# Patient Record
Sex: Female | Born: 1950 | Race: Black or African American | Hispanic: No | Smoking: Never smoker
Health system: Southern US, Community
[De-identification: ages and names within clinical notes are randomized; demographics above are authoritative.]

## PROBLEM LIST (undated history)

## (undated) DIAGNOSIS — E119 Type 2 diabetes mellitus without complications: Secondary | ICD-10-CM

## (undated) DIAGNOSIS — E785 Hyperlipidemia, unspecified: Secondary | ICD-10-CM

## (undated) DIAGNOSIS — I10 Essential (primary) hypertension: Secondary | ICD-10-CM

## (undated) DIAGNOSIS — M545 Low back pain, unspecified: Secondary | ICD-10-CM

## (undated) DIAGNOSIS — IMO0002 Reserved for concepts with insufficient information to code with codable children: Secondary | ICD-10-CM

## (undated) DIAGNOSIS — I2510 Atherosclerotic heart disease of native coronary artery without angina pectoris: Secondary | ICD-10-CM

## (undated) DIAGNOSIS — N189 Chronic kidney disease, unspecified: Secondary | ICD-10-CM

## (undated) DIAGNOSIS — H9192 Unspecified hearing loss, left ear: Secondary | ICD-10-CM

## (undated) DIAGNOSIS — Z9889 Other specified postprocedural states: Secondary | ICD-10-CM

## (undated) DIAGNOSIS — G43909 Migraine, unspecified, not intractable, without status migrainosus: Secondary | ICD-10-CM

## (undated) DIAGNOSIS — M797 Fibromyalgia: Secondary | ICD-10-CM

## (undated) DIAGNOSIS — L719 Rosacea, unspecified: Secondary | ICD-10-CM

## (undated) DIAGNOSIS — G4733 Obstructive sleep apnea (adult) (pediatric): Secondary | ICD-10-CM

## (undated) HISTORY — DX: Obstructive sleep apnea (adult) (pediatric): G47.33

## (undated) HISTORY — DX: Essential (primary) hypertension: I10

## (undated) HISTORY — DX: Type 2 diabetes mellitus without complications: E11.9

## (undated) HISTORY — DX: Atherosclerotic heart disease of native coronary artery without angina pectoris: I25.10

## (undated) HISTORY — DX: Low back pain, unspecified: M54.50

## (undated) HISTORY — DX: Hyperlipidemia, unspecified: E78.5

## (undated) HISTORY — DX: Fibromyalgia: M79.7

## (undated) HISTORY — DX: Unspecified hearing loss, left ear: H91.92

## (undated) HISTORY — DX: Reserved for concepts with insufficient information to code with codable children: IMO0002

## (undated) HISTORY — DX: Rosacea, unspecified: L71.9

## (undated) HISTORY — DX: Chronic kidney disease, unspecified: N18.9

## (undated) HISTORY — DX: Migraine, unspecified, not intractable, without status migrainosus: G43.909

---
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
## 1990-12-20 HISTORY — PX: TOTAL VAGINAL HYSTERECTOMY: SHX2548

## 2000-12-20 HISTORY — PX: ANKLE SURGERY: SHX546

## 2000-12-20 HISTORY — PX: CORONARY ARTERY BYPASS GRAFT: SHX141

## 2003-12-21 HISTORY — PX: HERNIA REPAIR: SHX51

## 2004-12-20 HISTORY — PX: WRIST SURGERY: SHX841

## 2007-01-20 HISTORY — PX: CORONARY ANGIOPLASTY: SHX604

## 2015-12-21 DIAGNOSIS — Z9889 Other specified postprocedural states: Secondary | ICD-10-CM

## 2015-12-21 HISTORY — PX: SHOULDER SURGERY: SHX246

## 2015-12-21 HISTORY — DX: Other specified postprocedural states: Z98.890

## 2016-12-28 DIAGNOSIS — R262 Difficulty in walking, not elsewhere classified: Secondary | ICD-10-CM | POA: Diagnosis not present

## 2016-12-28 DIAGNOSIS — M797 Fibromyalgia: Secondary | ICD-10-CM | POA: Diagnosis not present

## 2016-12-31 DIAGNOSIS — M797 Fibromyalgia: Secondary | ICD-10-CM | POA: Diagnosis not present

## 2016-12-31 DIAGNOSIS — R262 Difficulty in walking, not elsewhere classified: Secondary | ICD-10-CM | POA: Diagnosis not present

## 2017-01-11 DIAGNOSIS — R262 Difficulty in walking, not elsewhere classified: Secondary | ICD-10-CM | POA: Diagnosis not present

## 2017-01-11 DIAGNOSIS — M797 Fibromyalgia: Secondary | ICD-10-CM | POA: Diagnosis not present

## 2017-01-14 DIAGNOSIS — M797 Fibromyalgia: Secondary | ICD-10-CM | POA: Diagnosis not present

## 2017-01-14 DIAGNOSIS — R262 Difficulty in walking, not elsewhere classified: Secondary | ICD-10-CM | POA: Diagnosis not present

## 2017-01-20 HISTORY — PX: CARDIAC CATHETERIZATION: SHX172

## 2017-01-28 DIAGNOSIS — R4182 Altered mental status, unspecified: Secondary | ICD-10-CM | POA: Diagnosis not present

## 2017-01-28 DIAGNOSIS — G43719 Chronic migraine without aura, intractable, without status migrainosus: Secondary | ICD-10-CM | POA: Diagnosis not present

## 2017-01-31 DIAGNOSIS — R2689 Other abnormalities of gait and mobility: Secondary | ICD-10-CM | POA: Diagnosis not present

## 2017-02-03 DIAGNOSIS — R2689 Other abnormalities of gait and mobility: Secondary | ICD-10-CM | POA: Diagnosis not present

## 2017-02-08 DIAGNOSIS — R2689 Other abnormalities of gait and mobility: Secondary | ICD-10-CM | POA: Diagnosis not present

## 2017-02-10 DIAGNOSIS — R55 Syncope and collapse: Secondary | ICD-10-CM | POA: Diagnosis not present

## 2017-02-10 DIAGNOSIS — I5030 Unspecified diastolic (congestive) heart failure: Secondary | ICD-10-CM | POA: Diagnosis not present

## 2017-02-10 DIAGNOSIS — E669 Obesity, unspecified: Secondary | ICD-10-CM | POA: Diagnosis not present

## 2017-02-10 DIAGNOSIS — T50905S Adverse effect of unspecified drugs, medicaments and biological substances, sequela: Secondary | ICD-10-CM | POA: Diagnosis not present

## 2017-02-10 DIAGNOSIS — E785 Hyperlipidemia, unspecified: Secondary | ICD-10-CM | POA: Diagnosis not present

## 2017-02-10 DIAGNOSIS — R2689 Other abnormalities of gait and mobility: Secondary | ICD-10-CM | POA: Diagnosis not present

## 2017-02-10 DIAGNOSIS — I10 Essential (primary) hypertension: Secondary | ICD-10-CM | POA: Diagnosis not present

## 2017-02-10 DIAGNOSIS — E119 Type 2 diabetes mellitus without complications: Secondary | ICD-10-CM | POA: Diagnosis not present

## 2017-02-10 DIAGNOSIS — I259 Chronic ischemic heart disease, unspecified: Secondary | ICD-10-CM | POA: Diagnosis not present

## 2017-02-10 DIAGNOSIS — G4733 Obstructive sleep apnea (adult) (pediatric): Secondary | ICD-10-CM | POA: Diagnosis not present

## 2017-02-14 DIAGNOSIS — R2689 Other abnormalities of gait and mobility: Secondary | ICD-10-CM | POA: Diagnosis not present

## 2017-02-17 DIAGNOSIS — R2689 Other abnormalities of gait and mobility: Secondary | ICD-10-CM | POA: Diagnosis not present

## 2017-02-21 DIAGNOSIS — R2689 Other abnormalities of gait and mobility: Secondary | ICD-10-CM | POA: Diagnosis not present

## 2017-02-23 DIAGNOSIS — R2689 Other abnormalities of gait and mobility: Secondary | ICD-10-CM | POA: Diagnosis not present

## 2017-02-28 DIAGNOSIS — R2689 Other abnormalities of gait and mobility: Secondary | ICD-10-CM | POA: Diagnosis not present

## 2017-04-12 DIAGNOSIS — E669 Obesity, unspecified: Secondary | ICD-10-CM | POA: Diagnosis not present

## 2017-04-12 DIAGNOSIS — I951 Orthostatic hypotension: Secondary | ICD-10-CM | POA: Diagnosis not present

## 2017-04-12 DIAGNOSIS — I10 Essential (primary) hypertension: Secondary | ICD-10-CM | POA: Diagnosis not present

## 2017-04-12 DIAGNOSIS — G43011 Migraine without aura, intractable, with status migrainosus: Secondary | ICD-10-CM | POA: Diagnosis not present

## 2017-04-12 DIAGNOSIS — I700 Atherosclerosis of aorta: Secondary | ICD-10-CM | POA: Diagnosis not present

## 2017-04-12 DIAGNOSIS — J3089 Other allergic rhinitis: Secondary | ICD-10-CM | POA: Diagnosis not present

## 2017-04-12 DIAGNOSIS — I25118 Atherosclerotic heart disease of native coronary artery with other forms of angina pectoris: Secondary | ICD-10-CM | POA: Diagnosis not present

## 2017-04-12 DIAGNOSIS — E7800 Pure hypercholesterolemia, unspecified: Secondary | ICD-10-CM | POA: Diagnosis not present

## 2017-04-12 DIAGNOSIS — I208 Other forms of angina pectoris: Secondary | ICD-10-CM | POA: Diagnosis not present

## 2017-04-12 DIAGNOSIS — M3219 Other organ or system involvement in systemic lupus erythematosus: Secondary | ICD-10-CM | POA: Diagnosis not present

## 2017-04-12 DIAGNOSIS — E1165 Type 2 diabetes mellitus with hyperglycemia: Secondary | ICD-10-CM | POA: Diagnosis not present

## 2017-06-24 DIAGNOSIS — G43719 Chronic migraine without aura, intractable, without status migrainosus: Secondary | ICD-10-CM | POA: Diagnosis not present

## 2017-06-24 DIAGNOSIS — R2689 Other abnormalities of gait and mobility: Secondary | ICD-10-CM | POA: Diagnosis not present

## 2017-06-24 DIAGNOSIS — G43711 Chronic migraine without aura, intractable, with status migrainosus: Secondary | ICD-10-CM | POA: Diagnosis not present

## 2017-06-24 DIAGNOSIS — R51 Headache: Secondary | ICD-10-CM | POA: Diagnosis not present

## 2017-08-17 DIAGNOSIS — M3219 Other organ or system involvement in systemic lupus erythematosus: Secondary | ICD-10-CM | POA: Diagnosis not present

## 2017-08-17 DIAGNOSIS — J3089 Other allergic rhinitis: Secondary | ICD-10-CM | POA: Diagnosis not present

## 2017-08-17 DIAGNOSIS — I10 Essential (primary) hypertension: Secondary | ICD-10-CM | POA: Diagnosis not present

## 2017-08-17 DIAGNOSIS — E7800 Pure hypercholesterolemia, unspecified: Secondary | ICD-10-CM | POA: Diagnosis not present

## 2017-08-17 DIAGNOSIS — E669 Obesity, unspecified: Secondary | ICD-10-CM | POA: Diagnosis not present

## 2017-08-17 DIAGNOSIS — I25118 Atherosclerotic heart disease of native coronary artery with other forms of angina pectoris: Secondary | ICD-10-CM | POA: Diagnosis not present

## 2017-08-17 DIAGNOSIS — I700 Atherosclerosis of aorta: Secondary | ICD-10-CM | POA: Diagnosis not present

## 2017-08-17 DIAGNOSIS — G43011 Migraine without aura, intractable, with status migrainosus: Secondary | ICD-10-CM | POA: Diagnosis not present

## 2017-08-17 DIAGNOSIS — I951 Orthostatic hypotension: Secondary | ICD-10-CM | POA: Diagnosis not present

## 2017-08-17 DIAGNOSIS — E1165 Type 2 diabetes mellitus with hyperglycemia: Secondary | ICD-10-CM | POA: Diagnosis not present

## 2017-08-17 DIAGNOSIS — I208 Other forms of angina pectoris: Secondary | ICD-10-CM | POA: Diagnosis not present

## 2017-08-18 DIAGNOSIS — R2689 Other abnormalities of gait and mobility: Secondary | ICD-10-CM | POA: Diagnosis not present

## 2017-09-12 DIAGNOSIS — H8113 Benign paroxysmal vertigo, bilateral: Secondary | ICD-10-CM | POA: Diagnosis not present

## 2017-09-12 DIAGNOSIS — R2689 Other abnormalities of gait and mobility: Secondary | ICD-10-CM | POA: Diagnosis not present

## 2017-09-12 DIAGNOSIS — R531 Weakness: Secondary | ICD-10-CM | POA: Diagnosis not present

## 2017-09-12 DIAGNOSIS — R296 Repeated falls: Secondary | ICD-10-CM | POA: Diagnosis not present

## 2017-09-14 DIAGNOSIS — R296 Repeated falls: Secondary | ICD-10-CM | POA: Diagnosis not present

## 2017-09-14 DIAGNOSIS — H8113 Benign paroxysmal vertigo, bilateral: Secondary | ICD-10-CM | POA: Diagnosis not present

## 2017-09-14 DIAGNOSIS — R2689 Other abnormalities of gait and mobility: Secondary | ICD-10-CM | POA: Diagnosis not present

## 2017-09-14 DIAGNOSIS — R531 Weakness: Secondary | ICD-10-CM | POA: Diagnosis not present

## 2017-09-19 DIAGNOSIS — R531 Weakness: Secondary | ICD-10-CM | POA: Diagnosis not present

## 2017-09-19 DIAGNOSIS — R296 Repeated falls: Secondary | ICD-10-CM | POA: Diagnosis not present

## 2017-09-19 DIAGNOSIS — H8113 Benign paroxysmal vertigo, bilateral: Secondary | ICD-10-CM | POA: Diagnosis not present

## 2017-09-19 DIAGNOSIS — R2689 Other abnormalities of gait and mobility: Secondary | ICD-10-CM | POA: Diagnosis not present

## 2017-09-29 DIAGNOSIS — H8113 Benign paroxysmal vertigo, bilateral: Secondary | ICD-10-CM | POA: Diagnosis not present

## 2017-09-29 DIAGNOSIS — R531 Weakness: Secondary | ICD-10-CM | POA: Diagnosis not present

## 2017-09-29 DIAGNOSIS — R296 Repeated falls: Secondary | ICD-10-CM | POA: Diagnosis not present

## 2017-09-29 DIAGNOSIS — R2689 Other abnormalities of gait and mobility: Secondary | ICD-10-CM | POA: Diagnosis not present

## 2017-10-06 DIAGNOSIS — R531 Weakness: Secondary | ICD-10-CM | POA: Diagnosis not present

## 2017-10-06 DIAGNOSIS — H8113 Benign paroxysmal vertigo, bilateral: Secondary | ICD-10-CM | POA: Diagnosis not present

## 2017-10-06 DIAGNOSIS — R296 Repeated falls: Secondary | ICD-10-CM | POA: Diagnosis not present

## 2017-10-06 DIAGNOSIS — R2689 Other abnormalities of gait and mobility: Secondary | ICD-10-CM | POA: Diagnosis not present

## 2017-10-11 DIAGNOSIS — R2689 Other abnormalities of gait and mobility: Secondary | ICD-10-CM | POA: Diagnosis not present

## 2017-10-11 DIAGNOSIS — R531 Weakness: Secondary | ICD-10-CM | POA: Diagnosis not present

## 2017-10-11 DIAGNOSIS — H8113 Benign paroxysmal vertigo, bilateral: Secondary | ICD-10-CM | POA: Diagnosis not present

## 2017-10-11 DIAGNOSIS — R296 Repeated falls: Secondary | ICD-10-CM | POA: Diagnosis not present

## 2017-10-13 DIAGNOSIS — R296 Repeated falls: Secondary | ICD-10-CM | POA: Diagnosis not present

## 2017-10-13 DIAGNOSIS — R2689 Other abnormalities of gait and mobility: Secondary | ICD-10-CM | POA: Diagnosis not present

## 2017-10-13 DIAGNOSIS — H8113 Benign paroxysmal vertigo, bilateral: Secondary | ICD-10-CM | POA: Diagnosis not present

## 2017-10-13 DIAGNOSIS — R531 Weakness: Secondary | ICD-10-CM | POA: Diagnosis not present

## 2017-10-18 DIAGNOSIS — R2689 Other abnormalities of gait and mobility: Secondary | ICD-10-CM | POA: Diagnosis not present

## 2017-10-18 DIAGNOSIS — R531 Weakness: Secondary | ICD-10-CM | POA: Diagnosis not present

## 2017-10-18 DIAGNOSIS — H8113 Benign paroxysmal vertigo, bilateral: Secondary | ICD-10-CM | POA: Diagnosis not present

## 2017-10-18 DIAGNOSIS — R296 Repeated falls: Secondary | ICD-10-CM | POA: Diagnosis not present

## 2017-10-20 DIAGNOSIS — R2689 Other abnormalities of gait and mobility: Secondary | ICD-10-CM | POA: Diagnosis not present

## 2017-10-20 DIAGNOSIS — L718 Other rosacea: Secondary | ICD-10-CM | POA: Diagnosis not present

## 2017-10-20 DIAGNOSIS — R296 Repeated falls: Secondary | ICD-10-CM | POA: Diagnosis not present

## 2017-10-20 DIAGNOSIS — R531 Weakness: Secondary | ICD-10-CM | POA: Diagnosis not present

## 2017-10-20 DIAGNOSIS — H8113 Benign paroxysmal vertigo, bilateral: Secondary | ICD-10-CM | POA: Diagnosis not present

## 2017-10-25 DIAGNOSIS — H8113 Benign paroxysmal vertigo, bilateral: Secondary | ICD-10-CM | POA: Diagnosis not present

## 2017-10-25 DIAGNOSIS — R531 Weakness: Secondary | ICD-10-CM | POA: Diagnosis not present

## 2017-10-25 DIAGNOSIS — R2689 Other abnormalities of gait and mobility: Secondary | ICD-10-CM | POA: Diagnosis not present

## 2017-10-25 DIAGNOSIS — R296 Repeated falls: Secondary | ICD-10-CM | POA: Diagnosis not present

## 2017-10-27 DIAGNOSIS — R531 Weakness: Secondary | ICD-10-CM | POA: Diagnosis not present

## 2017-10-27 DIAGNOSIS — R296 Repeated falls: Secondary | ICD-10-CM | POA: Diagnosis not present

## 2017-10-27 DIAGNOSIS — H8113 Benign paroxysmal vertigo, bilateral: Secondary | ICD-10-CM | POA: Diagnosis not present

## 2017-10-27 DIAGNOSIS — R2689 Other abnormalities of gait and mobility: Secondary | ICD-10-CM | POA: Diagnosis not present

## 2017-11-02 DIAGNOSIS — R296 Repeated falls: Secondary | ICD-10-CM | POA: Diagnosis not present

## 2017-11-02 DIAGNOSIS — H8113 Benign paroxysmal vertigo, bilateral: Secondary | ICD-10-CM | POA: Diagnosis not present

## 2017-11-02 DIAGNOSIS — R531 Weakness: Secondary | ICD-10-CM | POA: Diagnosis not present

## 2017-11-02 DIAGNOSIS — R2689 Other abnormalities of gait and mobility: Secondary | ICD-10-CM | POA: Diagnosis not present

## 2017-11-04 DIAGNOSIS — R531 Weakness: Secondary | ICD-10-CM | POA: Diagnosis not present

## 2017-11-04 DIAGNOSIS — H8113 Benign paroxysmal vertigo, bilateral: Secondary | ICD-10-CM | POA: Diagnosis not present

## 2017-11-04 DIAGNOSIS — R296 Repeated falls: Secondary | ICD-10-CM | POA: Diagnosis not present

## 2017-11-04 DIAGNOSIS — R2689 Other abnormalities of gait and mobility: Secondary | ICD-10-CM | POA: Diagnosis not present

## 2017-11-28 DIAGNOSIS — H8113 Benign paroxysmal vertigo, bilateral: Secondary | ICD-10-CM | POA: Diagnosis not present

## 2017-11-28 DIAGNOSIS — R531 Weakness: Secondary | ICD-10-CM | POA: Diagnosis not present

## 2017-11-28 DIAGNOSIS — R2689 Other abnormalities of gait and mobility: Secondary | ICD-10-CM | POA: Diagnosis not present

## 2017-11-28 DIAGNOSIS — R296 Repeated falls: Secondary | ICD-10-CM | POA: Diagnosis not present

## 2017-11-30 DIAGNOSIS — R296 Repeated falls: Secondary | ICD-10-CM | POA: Diagnosis not present

## 2017-11-30 DIAGNOSIS — R531 Weakness: Secondary | ICD-10-CM | POA: Diagnosis not present

## 2017-11-30 DIAGNOSIS — H8113 Benign paroxysmal vertigo, bilateral: Secondary | ICD-10-CM | POA: Diagnosis not present

## 2017-11-30 DIAGNOSIS — R2689 Other abnormalities of gait and mobility: Secondary | ICD-10-CM | POA: Diagnosis not present

## 2017-12-01 DIAGNOSIS — L718 Other rosacea: Secondary | ICD-10-CM | POA: Diagnosis not present

## 2017-12-08 DIAGNOSIS — I259 Chronic ischemic heart disease, unspecified: Secondary | ICD-10-CM | POA: Diagnosis not present

## 2017-12-08 DIAGNOSIS — E782 Mixed hyperlipidemia: Secondary | ICD-10-CM | POA: Diagnosis present

## 2017-12-08 DIAGNOSIS — E785 Hyperlipidemia, unspecified: Secondary | ICD-10-CM | POA: Diagnosis not present

## 2017-12-08 DIAGNOSIS — Z9861 Coronary angioplasty status: Secondary | ICD-10-CM | POA: Diagnosis not present

## 2017-12-08 DIAGNOSIS — I951 Orthostatic hypotension: Secondary | ICD-10-CM | POA: Diagnosis present

## 2017-12-08 DIAGNOSIS — Z9119 Patient's noncompliance with other medical treatment and regimen: Secondary | ICD-10-CM | POA: Diagnosis not present

## 2017-12-08 DIAGNOSIS — Z7902 Long term (current) use of antithrombotics/antiplatelets: Secondary | ICD-10-CM | POA: Diagnosis not present

## 2017-12-08 DIAGNOSIS — I25118 Atherosclerotic heart disease of native coronary artery with other forms of angina pectoris: Secondary | ICD-10-CM | POA: Diagnosis not present

## 2017-12-08 DIAGNOSIS — Z87442 Personal history of urinary calculi: Secondary | ICD-10-CM | POA: Diagnosis not present

## 2017-12-08 DIAGNOSIS — Z885 Allergy status to narcotic agent status: Secondary | ICD-10-CM | POA: Diagnosis not present

## 2017-12-08 DIAGNOSIS — M797 Fibromyalgia: Secondary | ICD-10-CM | POA: Diagnosis present

## 2017-12-08 DIAGNOSIS — Z6831 Body mass index (BMI) 31.0-31.9, adult: Secondary | ICD-10-CM | POA: Diagnosis not present

## 2017-12-08 DIAGNOSIS — I252 Old myocardial infarction: Secondary | ICD-10-CM | POA: Diagnosis not present

## 2017-12-08 DIAGNOSIS — Z794 Long term (current) use of insulin: Secondary | ICD-10-CM | POA: Diagnosis not present

## 2017-12-08 DIAGNOSIS — Z01818 Encounter for other preprocedural examination: Secondary | ICD-10-CM | POA: Diagnosis not present

## 2017-12-08 DIAGNOSIS — R748 Abnormal levels of other serum enzymes: Secondary | ICD-10-CM | POA: Diagnosis not present

## 2017-12-08 DIAGNOSIS — T82855A Stenosis of coronary artery stent, initial encounter: Secondary | ICD-10-CM | POA: Diagnosis not present

## 2017-12-08 DIAGNOSIS — I200 Unstable angina: Secondary | ICD-10-CM | POA: Diagnosis not present

## 2017-12-08 DIAGNOSIS — E669 Obesity, unspecified: Secondary | ICD-10-CM | POA: Diagnosis present

## 2017-12-08 DIAGNOSIS — I25110 Atherosclerotic heart disease of native coronary artery with unstable angina pectoris: Secondary | ICD-10-CM | POA: Diagnosis present

## 2017-12-08 DIAGNOSIS — E1165 Type 2 diabetes mellitus with hyperglycemia: Secondary | ICD-10-CM | POA: Diagnosis present

## 2017-12-08 DIAGNOSIS — Z90710 Acquired absence of both cervix and uterus: Secondary | ICD-10-CM | POA: Diagnosis not present

## 2017-12-08 DIAGNOSIS — R079 Chest pain, unspecified: Secondary | ICD-10-CM | POA: Diagnosis not present

## 2017-12-08 DIAGNOSIS — M329 Systemic lupus erythematosus, unspecified: Secondary | ICD-10-CM | POA: Diagnosis present

## 2017-12-08 DIAGNOSIS — I700 Atherosclerosis of aorta: Secondary | ICD-10-CM | POA: Diagnosis present

## 2017-12-08 DIAGNOSIS — G4733 Obstructive sleep apnea (adult) (pediatric): Secondary | ICD-10-CM | POA: Diagnosis present

## 2017-12-08 DIAGNOSIS — E1122 Type 2 diabetes mellitus with diabetic chronic kidney disease: Secondary | ICD-10-CM | POA: Diagnosis not present

## 2017-12-08 DIAGNOSIS — Z951 Presence of aortocoronary bypass graft: Secondary | ICD-10-CM | POA: Diagnosis not present

## 2017-12-08 DIAGNOSIS — G43009 Migraine without aura, not intractable, without status migrainosus: Secondary | ICD-10-CM | POA: Diagnosis present

## 2017-12-08 DIAGNOSIS — I214 Non-ST elevation (NSTEMI) myocardial infarction: Secondary | ICD-10-CM | POA: Diagnosis not present

## 2017-12-08 DIAGNOSIS — N183 Chronic kidney disease, stage 3 (moderate): Secondary | ICD-10-CM | POA: Diagnosis not present

## 2017-12-08 DIAGNOSIS — Z888 Allergy status to other drugs, medicaments and biological substances status: Secondary | ICD-10-CM | POA: Diagnosis not present

## 2017-12-08 DIAGNOSIS — R072 Precordial pain: Secondary | ICD-10-CM | POA: Diagnosis not present

## 2017-12-08 DIAGNOSIS — I10 Essential (primary) hypertension: Secondary | ICD-10-CM | POA: Diagnosis present

## 2017-12-08 DIAGNOSIS — Z886 Allergy status to analgesic agent status: Secondary | ICD-10-CM | POA: Diagnosis not present

## 2017-12-08 DIAGNOSIS — I519 Heart disease, unspecified: Secondary | ICD-10-CM | POA: Diagnosis not present

## 2017-12-15 DIAGNOSIS — E119 Type 2 diabetes mellitus without complications: Secondary | ICD-10-CM | POA: Diagnosis not present

## 2017-12-15 DIAGNOSIS — I259 Chronic ischemic heart disease, unspecified: Secondary | ICD-10-CM | POA: Diagnosis not present

## 2017-12-15 DIAGNOSIS — E669 Obesity, unspecified: Secondary | ICD-10-CM | POA: Diagnosis not present

## 2017-12-15 DIAGNOSIS — E785 Hyperlipidemia, unspecified: Secondary | ICD-10-CM | POA: Diagnosis not present

## 2017-12-15 DIAGNOSIS — Z789 Other specified health status: Secondary | ICD-10-CM | POA: Diagnosis not present

## 2017-12-15 DIAGNOSIS — R55 Syncope and collapse: Secondary | ICD-10-CM | POA: Diagnosis not present

## 2017-12-15 DIAGNOSIS — G4733 Obstructive sleep apnea (adult) (pediatric): Secondary | ICD-10-CM | POA: Diagnosis not present

## 2017-12-15 DIAGNOSIS — I5030 Unspecified diastolic (congestive) heart failure: Secondary | ICD-10-CM | POA: Diagnosis not present

## 2017-12-15 DIAGNOSIS — I10 Essential (primary) hypertension: Secondary | ICD-10-CM | POA: Diagnosis not present

## 2017-12-15 DIAGNOSIS — I252 Old myocardial infarction: Secondary | ICD-10-CM | POA: Diagnosis not present

## 2018-01-02 DIAGNOSIS — G43011 Migraine without aura, intractable, with status migrainosus: Secondary | ICD-10-CM | POA: Diagnosis not present

## 2018-01-02 DIAGNOSIS — J3089 Other allergic rhinitis: Secondary | ICD-10-CM | POA: Diagnosis not present

## 2018-01-02 DIAGNOSIS — E1165 Type 2 diabetes mellitus with hyperglycemia: Secondary | ICD-10-CM | POA: Diagnosis not present

## 2018-01-02 DIAGNOSIS — I951 Orthostatic hypotension: Secondary | ICD-10-CM | POA: Diagnosis not present

## 2018-01-02 DIAGNOSIS — E7800 Pure hypercholesterolemia, unspecified: Secondary | ICD-10-CM | POA: Diagnosis not present

## 2018-01-02 DIAGNOSIS — I25118 Atherosclerotic heart disease of native coronary artery with other forms of angina pectoris: Secondary | ICD-10-CM | POA: Diagnosis not present

## 2018-01-02 DIAGNOSIS — M3219 Other organ or system involvement in systemic lupus erythematosus: Secondary | ICD-10-CM | POA: Diagnosis not present

## 2018-01-02 DIAGNOSIS — I214 Non-ST elevation (NSTEMI) myocardial infarction: Secondary | ICD-10-CM | POA: Diagnosis not present

## 2018-01-02 DIAGNOSIS — Z23 Encounter for immunization: Secondary | ICD-10-CM | POA: Diagnosis not present

## 2018-01-02 DIAGNOSIS — I10 Essential (primary) hypertension: Secondary | ICD-10-CM | POA: Diagnosis not present

## 2018-01-02 DIAGNOSIS — I208 Other forms of angina pectoris: Secondary | ICD-10-CM | POA: Diagnosis not present

## 2018-01-02 DIAGNOSIS — E669 Obesity, unspecified: Secondary | ICD-10-CM | POA: Diagnosis not present

## 2018-01-04 DIAGNOSIS — I2510 Atherosclerotic heart disease of native coronary artery without angina pectoris: Secondary | ICD-10-CM | POA: Diagnosis not present

## 2018-01-04 DIAGNOSIS — Z955 Presence of coronary angioplasty implant and graft: Secondary | ICD-10-CM | POA: Diagnosis not present

## 2018-01-11 DIAGNOSIS — I2510 Atherosclerotic heart disease of native coronary artery without angina pectoris: Secondary | ICD-10-CM | POA: Diagnosis not present

## 2018-01-11 DIAGNOSIS — Z955 Presence of coronary angioplasty implant and graft: Secondary | ICD-10-CM | POA: Diagnosis not present

## 2018-01-13 DIAGNOSIS — I259 Chronic ischemic heart disease, unspecified: Secondary | ICD-10-CM | POA: Diagnosis not present

## 2018-01-13 DIAGNOSIS — I10 Essential (primary) hypertension: Secondary | ICD-10-CM | POA: Diagnosis not present

## 2018-01-13 DIAGNOSIS — I252 Old myocardial infarction: Secondary | ICD-10-CM | POA: Diagnosis not present

## 2018-01-13 DIAGNOSIS — Z789 Other specified health status: Secondary | ICD-10-CM | POA: Diagnosis not present

## 2018-01-13 DIAGNOSIS — E785 Hyperlipidemia, unspecified: Secondary | ICD-10-CM | POA: Diagnosis not present

## 2018-01-13 DIAGNOSIS — G4733 Obstructive sleep apnea (adult) (pediatric): Secondary | ICD-10-CM | POA: Diagnosis not present

## 2018-01-13 DIAGNOSIS — E119 Type 2 diabetes mellitus without complications: Secondary | ICD-10-CM | POA: Diagnosis not present

## 2018-01-13 DIAGNOSIS — R55 Syncope and collapse: Secondary | ICD-10-CM | POA: Diagnosis not present

## 2018-01-13 DIAGNOSIS — I5030 Unspecified diastolic (congestive) heart failure: Secondary | ICD-10-CM | POA: Diagnosis not present

## 2018-01-13 DIAGNOSIS — E669 Obesity, unspecified: Secondary | ICD-10-CM | POA: Diagnosis not present

## 2018-01-16 DIAGNOSIS — Z955 Presence of coronary angioplasty implant and graft: Secondary | ICD-10-CM | POA: Diagnosis not present

## 2018-01-16 DIAGNOSIS — I2510 Atherosclerotic heart disease of native coronary artery without angina pectoris: Secondary | ICD-10-CM | POA: Diagnosis not present

## 2018-01-25 DIAGNOSIS — Z955 Presence of coronary angioplasty implant and graft: Secondary | ICD-10-CM | POA: Diagnosis not present

## 2018-01-25 DIAGNOSIS — I2510 Atherosclerotic heart disease of native coronary artery without angina pectoris: Secondary | ICD-10-CM | POA: Diagnosis not present

## 2018-01-31 DIAGNOSIS — I2510 Atherosclerotic heart disease of native coronary artery without angina pectoris: Secondary | ICD-10-CM | POA: Diagnosis not present

## 2018-01-31 DIAGNOSIS — Z955 Presence of coronary angioplasty implant and graft: Secondary | ICD-10-CM | POA: Diagnosis not present

## 2018-02-02 DIAGNOSIS — I2510 Atherosclerotic heart disease of native coronary artery without angina pectoris: Secondary | ICD-10-CM | POA: Diagnosis not present

## 2018-02-02 DIAGNOSIS — Z955 Presence of coronary angioplasty implant and graft: Secondary | ICD-10-CM | POA: Diagnosis not present

## 2018-02-06 DIAGNOSIS — I2510 Atherosclerotic heart disease of native coronary artery without angina pectoris: Secondary | ICD-10-CM | POA: Diagnosis not present

## 2018-02-06 DIAGNOSIS — Z955 Presence of coronary angioplasty implant and graft: Secondary | ICD-10-CM | POA: Diagnosis not present

## 2018-02-13 DIAGNOSIS — Z955 Presence of coronary angioplasty implant and graft: Secondary | ICD-10-CM | POA: Diagnosis not present

## 2018-02-13 DIAGNOSIS — I2510 Atherosclerotic heart disease of native coronary artery without angina pectoris: Secondary | ICD-10-CM | POA: Diagnosis not present

## 2018-02-27 DIAGNOSIS — Z955 Presence of coronary angioplasty implant and graft: Secondary | ICD-10-CM | POA: Diagnosis not present

## 2018-02-27 DIAGNOSIS — I2510 Atherosclerotic heart disease of native coronary artery without angina pectoris: Secondary | ICD-10-CM | POA: Diagnosis not present

## 2018-03-01 DIAGNOSIS — I2510 Atherosclerotic heart disease of native coronary artery without angina pectoris: Secondary | ICD-10-CM | POA: Diagnosis not present

## 2018-03-01 DIAGNOSIS — Z955 Presence of coronary angioplasty implant and graft: Secondary | ICD-10-CM | POA: Diagnosis not present

## 2018-03-10 DIAGNOSIS — E1165 Type 2 diabetes mellitus with hyperglycemia: Secondary | ICD-10-CM | POA: Diagnosis not present

## 2018-03-10 DIAGNOSIS — R197 Diarrhea, unspecified: Secondary | ICD-10-CM | POA: Diagnosis not present

## 2018-03-13 DIAGNOSIS — Z955 Presence of coronary angioplasty implant and graft: Secondary | ICD-10-CM | POA: Diagnosis not present

## 2018-03-13 DIAGNOSIS — I2510 Atherosclerotic heart disease of native coronary artery without angina pectoris: Secondary | ICD-10-CM | POA: Diagnosis not present

## 2018-03-13 DIAGNOSIS — L299 Pruritus, unspecified: Secondary | ICD-10-CM | POA: Diagnosis not present

## 2018-03-13 DIAGNOSIS — L718 Other rosacea: Secondary | ICD-10-CM | POA: Diagnosis not present

## 2018-03-15 DIAGNOSIS — Z955 Presence of coronary angioplasty implant and graft: Secondary | ICD-10-CM | POA: Diagnosis not present

## 2018-03-15 DIAGNOSIS — I2510 Atherosclerotic heart disease of native coronary artery without angina pectoris: Secondary | ICD-10-CM | POA: Diagnosis not present

## 2018-03-20 DIAGNOSIS — I2510 Atherosclerotic heart disease of native coronary artery without angina pectoris: Secondary | ICD-10-CM | POA: Diagnosis not present

## 2018-03-20 DIAGNOSIS — Z955 Presence of coronary angioplasty implant and graft: Secondary | ICD-10-CM | POA: Diagnosis not present

## 2018-03-22 DIAGNOSIS — I2510 Atherosclerotic heart disease of native coronary artery without angina pectoris: Secondary | ICD-10-CM | POA: Diagnosis not present

## 2018-03-22 DIAGNOSIS — Z955 Presence of coronary angioplasty implant and graft: Secondary | ICD-10-CM | POA: Diagnosis not present

## 2018-03-27 DIAGNOSIS — I2510 Atherosclerotic heart disease of native coronary artery without angina pectoris: Secondary | ICD-10-CM | POA: Diagnosis not present

## 2018-03-27 DIAGNOSIS — M154 Erosive (osteo)arthritis: Secondary | ICD-10-CM | POA: Diagnosis not present

## 2018-03-27 DIAGNOSIS — E559 Vitamin D deficiency, unspecified: Secondary | ICD-10-CM | POA: Diagnosis not present

## 2018-03-27 DIAGNOSIS — M542 Cervicalgia: Secondary | ICD-10-CM | POA: Diagnosis not present

## 2018-03-27 DIAGNOSIS — M797 Fibromyalgia: Secondary | ICD-10-CM | POA: Diagnosis not present

## 2018-03-27 DIAGNOSIS — Z955 Presence of coronary angioplasty implant and graft: Secondary | ICD-10-CM | POA: Diagnosis not present

## 2018-03-29 DIAGNOSIS — I2510 Atherosclerotic heart disease of native coronary artery without angina pectoris: Secondary | ICD-10-CM | POA: Diagnosis not present

## 2018-03-29 DIAGNOSIS — Z955 Presence of coronary angioplasty implant and graft: Secondary | ICD-10-CM | POA: Diagnosis not present

## 2018-03-30 DIAGNOSIS — M797 Fibromyalgia: Secondary | ICD-10-CM | POA: Diagnosis not present

## 2018-03-30 DIAGNOSIS — M542 Cervicalgia: Secondary | ICD-10-CM | POA: Diagnosis not present

## 2018-03-30 DIAGNOSIS — R293 Abnormal posture: Secondary | ICD-10-CM | POA: Diagnosis not present

## 2018-03-30 DIAGNOSIS — M25512 Pain in left shoulder: Secondary | ICD-10-CM | POA: Diagnosis not present

## 2018-04-03 DIAGNOSIS — M797 Fibromyalgia: Secondary | ICD-10-CM | POA: Diagnosis not present

## 2018-04-03 DIAGNOSIS — M542 Cervicalgia: Secondary | ICD-10-CM | POA: Diagnosis not present

## 2018-04-03 DIAGNOSIS — I2510 Atherosclerotic heart disease of native coronary artery without angina pectoris: Secondary | ICD-10-CM | POA: Diagnosis not present

## 2018-04-03 DIAGNOSIS — M25512 Pain in left shoulder: Secondary | ICD-10-CM | POA: Diagnosis not present

## 2018-04-03 DIAGNOSIS — R293 Abnormal posture: Secondary | ICD-10-CM | POA: Diagnosis not present

## 2018-04-03 DIAGNOSIS — Z955 Presence of coronary angioplasty implant and graft: Secondary | ICD-10-CM | POA: Diagnosis not present

## 2018-04-05 DIAGNOSIS — Z955 Presence of coronary angioplasty implant and graft: Secondary | ICD-10-CM | POA: Diagnosis not present

## 2018-04-05 DIAGNOSIS — I2510 Atherosclerotic heart disease of native coronary artery without angina pectoris: Secondary | ICD-10-CM | POA: Diagnosis not present

## 2018-04-13 DIAGNOSIS — R600 Localized edema: Secondary | ICD-10-CM | POA: Diagnosis not present

## 2018-04-18 DIAGNOSIS — I2510 Atherosclerotic heart disease of native coronary artery without angina pectoris: Secondary | ICD-10-CM | POA: Diagnosis not present

## 2018-04-18 DIAGNOSIS — M797 Fibromyalgia: Secondary | ICD-10-CM | POA: Diagnosis not present

## 2018-04-18 DIAGNOSIS — Z1211 Encounter for screening for malignant neoplasm of colon: Secondary | ICD-10-CM | POA: Diagnosis not present

## 2018-04-18 DIAGNOSIS — E559 Vitamin D deficiency, unspecified: Secondary | ICD-10-CM | POA: Diagnosis not present

## 2018-04-18 DIAGNOSIS — Z955 Presence of coronary angioplasty implant and graft: Secondary | ICD-10-CM | POA: Diagnosis not present

## 2018-04-20 DIAGNOSIS — I2510 Atherosclerotic heart disease of native coronary artery without angina pectoris: Secondary | ICD-10-CM | POA: Diagnosis not present

## 2018-04-20 DIAGNOSIS — Z955 Presence of coronary angioplasty implant and graft: Secondary | ICD-10-CM | POA: Diagnosis not present

## 2018-04-21 DIAGNOSIS — M542 Cervicalgia: Secondary | ICD-10-CM | POA: Diagnosis not present

## 2018-04-21 DIAGNOSIS — M25512 Pain in left shoulder: Secondary | ICD-10-CM | POA: Diagnosis not present

## 2018-04-21 DIAGNOSIS — M797 Fibromyalgia: Secondary | ICD-10-CM | POA: Diagnosis not present

## 2018-04-21 DIAGNOSIS — R293 Abnormal posture: Secondary | ICD-10-CM | POA: Diagnosis not present

## 2018-04-25 DIAGNOSIS — Z955 Presence of coronary angioplasty implant and graft: Secondary | ICD-10-CM | POA: Diagnosis not present

## 2018-04-25 DIAGNOSIS — I2510 Atherosclerotic heart disease of native coronary artery without angina pectoris: Secondary | ICD-10-CM | POA: Diagnosis not present

## 2018-04-27 DIAGNOSIS — I2510 Atherosclerotic heart disease of native coronary artery without angina pectoris: Secondary | ICD-10-CM | POA: Diagnosis not present

## 2018-04-27 DIAGNOSIS — Z955 Presence of coronary angioplasty implant and graft: Secondary | ICD-10-CM | POA: Diagnosis not present

## 2018-05-01 DIAGNOSIS — M542 Cervicalgia: Secondary | ICD-10-CM | POA: Diagnosis not present

## 2018-05-01 DIAGNOSIS — M47812 Spondylosis without myelopathy or radiculopathy, cervical region: Secondary | ICD-10-CM | POA: Diagnosis not present

## 2018-05-01 DIAGNOSIS — M5412 Radiculopathy, cervical region: Secondary | ICD-10-CM | POA: Diagnosis not present

## 2018-05-01 DIAGNOSIS — R293 Abnormal posture: Secondary | ICD-10-CM | POA: Diagnosis not present

## 2018-05-01 DIAGNOSIS — M4802 Spinal stenosis, cervical region: Secondary | ICD-10-CM | POA: Diagnosis not present

## 2018-05-01 DIAGNOSIS — G8929 Other chronic pain: Secondary | ICD-10-CM | POA: Diagnosis not present

## 2018-05-01 DIAGNOSIS — M797 Fibromyalgia: Secondary | ICD-10-CM | POA: Diagnosis not present

## 2018-05-01 DIAGNOSIS — E1165 Type 2 diabetes mellitus with hyperglycemia: Secondary | ICD-10-CM | POA: Diagnosis not present

## 2018-05-01 DIAGNOSIS — M7918 Myalgia, other site: Secondary | ICD-10-CM | POA: Diagnosis not present

## 2018-05-01 DIAGNOSIS — M25512 Pain in left shoulder: Secondary | ICD-10-CM | POA: Diagnosis not present

## 2018-05-01 DIAGNOSIS — I25118 Atherosclerotic heart disease of native coronary artery with other forms of angina pectoris: Secondary | ICD-10-CM | POA: Diagnosis not present

## 2018-05-01 DIAGNOSIS — E669 Obesity, unspecified: Secondary | ICD-10-CM | POA: Diagnosis not present

## 2018-05-02 DIAGNOSIS — I25118 Atherosclerotic heart disease of native coronary artery with other forms of angina pectoris: Secondary | ICD-10-CM | POA: Diagnosis not present

## 2018-05-02 DIAGNOSIS — E1165 Type 2 diabetes mellitus with hyperglycemia: Secondary | ICD-10-CM | POA: Diagnosis not present

## 2018-05-02 DIAGNOSIS — I700 Atherosclerosis of aorta: Secondary | ICD-10-CM | POA: Diagnosis not present

## 2018-05-02 DIAGNOSIS — J3089 Other allergic rhinitis: Secondary | ICD-10-CM | POA: Diagnosis not present

## 2018-05-02 DIAGNOSIS — N183 Chronic kidney disease, stage 3 (moderate): Secondary | ICD-10-CM | POA: Diagnosis not present

## 2018-05-02 DIAGNOSIS — Z13820 Encounter for screening for osteoporosis: Secondary | ICD-10-CM | POA: Diagnosis not present

## 2018-05-02 DIAGNOSIS — M3219 Other organ or system involvement in systemic lupus erythematosus: Secondary | ICD-10-CM | POA: Diagnosis not present

## 2018-05-02 DIAGNOSIS — I951 Orthostatic hypotension: Secondary | ICD-10-CM | POA: Diagnosis not present

## 2018-05-02 DIAGNOSIS — E7800 Pure hypercholesterolemia, unspecified: Secondary | ICD-10-CM | POA: Diagnosis not present

## 2018-05-02 DIAGNOSIS — I10 Essential (primary) hypertension: Secondary | ICD-10-CM | POA: Diagnosis not present

## 2018-05-02 DIAGNOSIS — E669 Obesity, unspecified: Secondary | ICD-10-CM | POA: Diagnosis not present

## 2018-05-02 DIAGNOSIS — I2510 Atherosclerotic heart disease of native coronary artery without angina pectoris: Secondary | ICD-10-CM | POA: Diagnosis not present

## 2018-05-02 DIAGNOSIS — Z955 Presence of coronary angioplasty implant and graft: Secondary | ICD-10-CM | POA: Diagnosis not present

## 2018-05-02 DIAGNOSIS — Z0000 Encounter for general adult medical examination without abnormal findings: Secondary | ICD-10-CM | POA: Diagnosis not present

## 2018-05-04 DIAGNOSIS — I2510 Atherosclerotic heart disease of native coronary artery without angina pectoris: Secondary | ICD-10-CM | POA: Diagnosis not present

## 2018-05-04 DIAGNOSIS — Z955 Presence of coronary angioplasty implant and graft: Secondary | ICD-10-CM | POA: Diagnosis not present

## 2018-05-05 DIAGNOSIS — Z13820 Encounter for screening for osteoporosis: Secondary | ICD-10-CM | POA: Diagnosis not present

## 2018-05-08 DIAGNOSIS — I2510 Atherosclerotic heart disease of native coronary artery without angina pectoris: Secondary | ICD-10-CM | POA: Diagnosis not present

## 2018-05-08 DIAGNOSIS — Z955 Presence of coronary angioplasty implant and graft: Secondary | ICD-10-CM | POA: Diagnosis not present

## 2018-05-10 DIAGNOSIS — R293 Abnormal posture: Secondary | ICD-10-CM | POA: Diagnosis not present

## 2018-05-10 DIAGNOSIS — M797 Fibromyalgia: Secondary | ICD-10-CM | POA: Diagnosis not present

## 2018-05-10 DIAGNOSIS — M542 Cervicalgia: Secondary | ICD-10-CM | POA: Diagnosis not present

## 2018-05-10 DIAGNOSIS — M25512 Pain in left shoulder: Secondary | ICD-10-CM | POA: Diagnosis not present

## 2018-05-11 DIAGNOSIS — Z955 Presence of coronary angioplasty implant and graft: Secondary | ICD-10-CM | POA: Diagnosis not present

## 2018-05-11 DIAGNOSIS — R55 Syncope and collapse: Secondary | ICD-10-CM | POA: Diagnosis not present

## 2018-05-11 DIAGNOSIS — E119 Type 2 diabetes mellitus without complications: Secondary | ICD-10-CM | POA: Diagnosis not present

## 2018-05-11 DIAGNOSIS — I10 Essential (primary) hypertension: Secondary | ICD-10-CM | POA: Diagnosis not present

## 2018-05-11 DIAGNOSIS — G4733 Obstructive sleep apnea (adult) (pediatric): Secondary | ICD-10-CM | POA: Diagnosis not present

## 2018-05-11 DIAGNOSIS — I2510 Atherosclerotic heart disease of native coronary artery without angina pectoris: Secondary | ICD-10-CM | POA: Diagnosis not present

## 2018-05-11 DIAGNOSIS — I259 Chronic ischemic heart disease, unspecified: Secondary | ICD-10-CM | POA: Diagnosis not present

## 2018-05-11 DIAGNOSIS — I5030 Unspecified diastolic (congestive) heart failure: Secondary | ICD-10-CM | POA: Diagnosis not present

## 2018-05-11 DIAGNOSIS — Z789 Other specified health status: Secondary | ICD-10-CM | POA: Diagnosis not present

## 2018-05-11 DIAGNOSIS — E785 Hyperlipidemia, unspecified: Secondary | ICD-10-CM | POA: Diagnosis not present

## 2018-05-11 DIAGNOSIS — E669 Obesity, unspecified: Secondary | ICD-10-CM | POA: Diagnosis not present

## 2018-05-11 DIAGNOSIS — I252 Old myocardial infarction: Secondary | ICD-10-CM | POA: Diagnosis not present

## 2018-05-16 DIAGNOSIS — L659 Nonscarring hair loss, unspecified: Secondary | ICD-10-CM | POA: Diagnosis not present

## 2018-05-16 DIAGNOSIS — L718 Other rosacea: Secondary | ICD-10-CM | POA: Diagnosis not present

## 2018-05-18 DIAGNOSIS — R293 Abnormal posture: Secondary | ICD-10-CM | POA: Diagnosis not present

## 2018-05-18 DIAGNOSIS — Z955 Presence of coronary angioplasty implant and graft: Secondary | ICD-10-CM | POA: Diagnosis not present

## 2018-05-18 DIAGNOSIS — M542 Cervicalgia: Secondary | ICD-10-CM | POA: Diagnosis not present

## 2018-05-18 DIAGNOSIS — M25512 Pain in left shoulder: Secondary | ICD-10-CM | POA: Diagnosis not present

## 2018-05-18 DIAGNOSIS — I2510 Atherosclerotic heart disease of native coronary artery without angina pectoris: Secondary | ICD-10-CM | POA: Diagnosis not present

## 2018-05-18 DIAGNOSIS — M797 Fibromyalgia: Secondary | ICD-10-CM | POA: Diagnosis not present

## 2018-05-18 DIAGNOSIS — E1165 Type 2 diabetes mellitus with hyperglycemia: Secondary | ICD-10-CM | POA: Diagnosis not present

## 2018-05-23 DIAGNOSIS — Z955 Presence of coronary angioplasty implant and graft: Secondary | ICD-10-CM | POA: Diagnosis not present

## 2018-05-23 DIAGNOSIS — I2510 Atherosclerotic heart disease of native coronary artery without angina pectoris: Secondary | ICD-10-CM | POA: Diagnosis not present

## 2018-05-25 DIAGNOSIS — E1165 Type 2 diabetes mellitus with hyperglycemia: Secondary | ICD-10-CM | POA: Diagnosis not present

## 2018-05-31 DIAGNOSIS — M47812 Spondylosis without myelopathy or radiculopathy, cervical region: Secondary | ICD-10-CM | POA: Diagnosis not present

## 2018-06-02 DIAGNOSIS — M25512 Pain in left shoulder: Secondary | ICD-10-CM | POA: Diagnosis not present

## 2018-06-02 DIAGNOSIS — M542 Cervicalgia: Secondary | ICD-10-CM | POA: Diagnosis not present

## 2018-06-02 DIAGNOSIS — M797 Fibromyalgia: Secondary | ICD-10-CM | POA: Diagnosis not present

## 2018-06-02 DIAGNOSIS — R293 Abnormal posture: Secondary | ICD-10-CM | POA: Diagnosis not present

## 2018-06-08 DIAGNOSIS — E1165 Type 2 diabetes mellitus with hyperglycemia: Secondary | ICD-10-CM | POA: Diagnosis not present

## 2018-06-15 DIAGNOSIS — M47812 Spondylosis without myelopathy or radiculopathy, cervical region: Secondary | ICD-10-CM | POA: Diagnosis not present

## 2018-07-04 DIAGNOSIS — G43719 Chronic migraine without aura, intractable, without status migrainosus: Secondary | ICD-10-CM | POA: Diagnosis not present

## 2018-07-04 DIAGNOSIS — G43711 Chronic migraine without aura, intractable, with status migrainosus: Secondary | ICD-10-CM | POA: Diagnosis not present

## 2018-07-06 DIAGNOSIS — E1165 Type 2 diabetes mellitus with hyperglycemia: Secondary | ICD-10-CM | POA: Diagnosis not present

## 2018-08-02 DIAGNOSIS — M47812 Spondylosis without myelopathy or radiculopathy, cervical region: Secondary | ICD-10-CM | POA: Diagnosis not present

## 2018-08-02 DIAGNOSIS — M7989 Other specified soft tissue disorders: Secondary | ICD-10-CM | POA: Diagnosis not present

## 2018-08-02 DIAGNOSIS — I25118 Atherosclerotic heart disease of native coronary artery with other forms of angina pectoris: Secondary | ICD-10-CM | POA: Diagnosis not present

## 2018-08-02 DIAGNOSIS — M19042 Primary osteoarthritis, left hand: Secondary | ICD-10-CM | POA: Diagnosis not present

## 2018-08-02 DIAGNOSIS — E669 Obesity, unspecified: Secondary | ICD-10-CM | POA: Diagnosis not present

## 2018-08-02 DIAGNOSIS — M4802 Spinal stenosis, cervical region: Secondary | ICD-10-CM | POA: Diagnosis not present

## 2018-08-02 DIAGNOSIS — G8929 Other chronic pain: Secondary | ICD-10-CM | POA: Diagnosis not present

## 2018-08-02 DIAGNOSIS — M1812 Unilateral primary osteoarthritis of first carpometacarpal joint, left hand: Secondary | ICD-10-CM | POA: Diagnosis not present

## 2018-08-02 DIAGNOSIS — M79642 Pain in left hand: Secondary | ICD-10-CM | POA: Diagnosis not present

## 2018-08-02 DIAGNOSIS — M7918 Myalgia, other site: Secondary | ICD-10-CM | POA: Diagnosis not present

## 2018-08-02 DIAGNOSIS — M5412 Radiculopathy, cervical region: Secondary | ICD-10-CM | POA: Diagnosis not present

## 2018-08-02 DIAGNOSIS — E1165 Type 2 diabetes mellitus with hyperglycemia: Secondary | ICD-10-CM | POA: Diagnosis not present

## 2018-08-09 DIAGNOSIS — M47812 Spondylosis without myelopathy or radiculopathy, cervical region: Secondary | ICD-10-CM | POA: Diagnosis not present

## 2018-09-04 DIAGNOSIS — I951 Orthostatic hypotension: Secondary | ICD-10-CM | POA: Diagnosis not present

## 2018-09-04 DIAGNOSIS — E669 Obesity, unspecified: Secondary | ICD-10-CM | POA: Diagnosis not present

## 2018-09-04 DIAGNOSIS — Z6832 Body mass index (BMI) 32.0-32.9, adult: Secondary | ICD-10-CM | POA: Diagnosis not present

## 2018-09-04 DIAGNOSIS — N183 Chronic kidney disease, stage 3 (moderate): Secondary | ICD-10-CM | POA: Diagnosis not present

## 2018-09-04 DIAGNOSIS — E7800 Pure hypercholesterolemia, unspecified: Secondary | ICD-10-CM | POA: Diagnosis not present

## 2018-09-04 DIAGNOSIS — I10 Essential (primary) hypertension: Secondary | ICD-10-CM | POA: Diagnosis not present

## 2018-09-04 DIAGNOSIS — I700 Atherosclerosis of aorta: Secondary | ICD-10-CM | POA: Diagnosis not present

## 2018-09-04 DIAGNOSIS — E1165 Type 2 diabetes mellitus with hyperglycemia: Secondary | ICD-10-CM | POA: Diagnosis not present

## 2018-09-04 DIAGNOSIS — I25118 Atherosclerotic heart disease of native coronary artery with other forms of angina pectoris: Secondary | ICD-10-CM | POA: Diagnosis not present

## 2018-09-04 DIAGNOSIS — M3219 Other organ or system involvement in systemic lupus erythematosus: Secondary | ICD-10-CM | POA: Diagnosis not present

## 2018-09-04 DIAGNOSIS — J3089 Other allergic rhinitis: Secondary | ICD-10-CM | POA: Diagnosis not present

## 2018-09-11 DIAGNOSIS — G8929 Other chronic pain: Secondary | ICD-10-CM | POA: Diagnosis not present

## 2018-09-11 DIAGNOSIS — M5412 Radiculopathy, cervical region: Secondary | ICD-10-CM | POA: Diagnosis not present

## 2018-09-11 DIAGNOSIS — M7918 Myalgia, other site: Secondary | ICD-10-CM | POA: Diagnosis not present

## 2018-09-11 DIAGNOSIS — E1165 Type 2 diabetes mellitus with hyperglycemia: Secondary | ICD-10-CM | POA: Diagnosis not present

## 2018-09-11 DIAGNOSIS — I25118 Atherosclerotic heart disease of native coronary artery with other forms of angina pectoris: Secondary | ICD-10-CM | POA: Diagnosis not present

## 2018-09-11 DIAGNOSIS — E559 Vitamin D deficiency, unspecified: Secondary | ICD-10-CM | POA: Diagnosis not present

## 2018-09-11 DIAGNOSIS — M4802 Spinal stenosis, cervical region: Secondary | ICD-10-CM | POA: Diagnosis not present

## 2018-09-11 DIAGNOSIS — M154 Erosive (osteo)arthritis: Secondary | ICD-10-CM | POA: Diagnosis not present

## 2018-09-11 DIAGNOSIS — M797 Fibromyalgia: Secondary | ICD-10-CM | POA: Diagnosis not present

## 2018-09-11 DIAGNOSIS — M47812 Spondylosis without myelopathy or radiculopathy, cervical region: Secondary | ICD-10-CM | POA: Diagnosis not present

## 2018-09-11 DIAGNOSIS — E669 Obesity, unspecified: Secondary | ICD-10-CM | POA: Diagnosis not present

## 2018-09-11 DIAGNOSIS — M542 Cervicalgia: Secondary | ICD-10-CM | POA: Diagnosis not present

## 2018-09-25 DIAGNOSIS — G4733 Obstructive sleep apnea (adult) (pediatric): Secondary | ICD-10-CM | POA: Diagnosis not present

## 2018-09-25 DIAGNOSIS — I10 Essential (primary) hypertension: Secondary | ICD-10-CM | POA: Diagnosis not present

## 2018-09-25 DIAGNOSIS — E785 Hyperlipidemia, unspecified: Secondary | ICD-10-CM | POA: Diagnosis not present

## 2018-09-25 DIAGNOSIS — Z789 Other specified health status: Secondary | ICD-10-CM | POA: Diagnosis not present

## 2018-09-25 DIAGNOSIS — E669 Obesity, unspecified: Secondary | ICD-10-CM | POA: Diagnosis not present

## 2018-09-25 DIAGNOSIS — I259 Chronic ischemic heart disease, unspecified: Secondary | ICD-10-CM | POA: Diagnosis not present

## 2018-09-25 DIAGNOSIS — E119 Type 2 diabetes mellitus without complications: Secondary | ICD-10-CM | POA: Diagnosis not present

## 2018-09-25 DIAGNOSIS — I252 Old myocardial infarction: Secondary | ICD-10-CM | POA: Diagnosis not present

## 2018-09-25 DIAGNOSIS — R002 Palpitations: Secondary | ICD-10-CM | POA: Diagnosis not present

## 2018-09-25 DIAGNOSIS — R55 Syncope and collapse: Secondary | ICD-10-CM | POA: Diagnosis not present

## 2018-09-25 DIAGNOSIS — I5030 Unspecified diastolic (congestive) heart failure: Secondary | ICD-10-CM | POA: Diagnosis not present

## 2018-10-10 DIAGNOSIS — I25118 Atherosclerotic heart disease of native coronary artery with other forms of angina pectoris: Secondary | ICD-10-CM | POA: Diagnosis not present

## 2018-10-10 DIAGNOSIS — M3219 Other organ or system involvement in systemic lupus erythematosus: Secondary | ICD-10-CM | POA: Diagnosis not present

## 2018-10-10 DIAGNOSIS — N183 Chronic kidney disease, stage 3 (moderate): Secondary | ICD-10-CM | POA: Diagnosis not present

## 2018-10-10 DIAGNOSIS — I951 Orthostatic hypotension: Secondary | ICD-10-CM | POA: Diagnosis not present

## 2018-10-10 DIAGNOSIS — I10 Essential (primary) hypertension: Secondary | ICD-10-CM | POA: Diagnosis not present

## 2018-10-10 DIAGNOSIS — E7800 Pure hypercholesterolemia, unspecified: Secondary | ICD-10-CM | POA: Diagnosis not present

## 2018-10-10 DIAGNOSIS — E669 Obesity, unspecified: Secondary | ICD-10-CM | POA: Diagnosis not present

## 2018-10-10 DIAGNOSIS — H6982 Other specified disorders of Eustachian tube, left ear: Secondary | ICD-10-CM | POA: Diagnosis not present

## 2018-10-10 DIAGNOSIS — I700 Atherosclerosis of aorta: Secondary | ICD-10-CM | POA: Diagnosis not present

## 2018-10-10 DIAGNOSIS — E1165 Type 2 diabetes mellitus with hyperglycemia: Secondary | ICD-10-CM | POA: Diagnosis not present

## 2018-10-10 DIAGNOSIS — J3089 Other allergic rhinitis: Secondary | ICD-10-CM | POA: Diagnosis not present

## 2018-12-04 DIAGNOSIS — E0865 Diabetes mellitus due to underlying condition with hyperglycemia: Secondary | ICD-10-CM | POA: Diagnosis not present

## 2018-12-04 DIAGNOSIS — Z01118 Encounter for examination of ears and hearing with other abnormal findings: Secondary | ICD-10-CM | POA: Diagnosis not present

## 2018-12-04 DIAGNOSIS — E1165 Type 2 diabetes mellitus with hyperglycemia: Secondary | ICD-10-CM | POA: Diagnosis not present

## 2018-12-04 DIAGNOSIS — I2510 Atherosclerotic heart disease of native coronary artery without angina pectoris: Secondary | ICD-10-CM | POA: Diagnosis not present

## 2018-12-04 DIAGNOSIS — Z1329 Encounter for screening for other suspected endocrine disorder: Secondary | ICD-10-CM | POA: Diagnosis not present

## 2018-12-04 DIAGNOSIS — E669 Obesity, unspecified: Secondary | ICD-10-CM | POA: Diagnosis not present

## 2018-12-04 DIAGNOSIS — R1013 Epigastric pain: Secondary | ICD-10-CM | POA: Diagnosis not present

## 2018-12-04 DIAGNOSIS — Z136 Encounter for screening for cardiovascular disorders: Secondary | ICD-10-CM | POA: Diagnosis not present

## 2018-12-04 DIAGNOSIS — I10 Essential (primary) hypertension: Secondary | ICD-10-CM | POA: Diagnosis not present

## 2018-12-04 DIAGNOSIS — Z131 Encounter for screening for diabetes mellitus: Secondary | ICD-10-CM | POA: Diagnosis not present

## 2018-12-08 DIAGNOSIS — I2510 Atherosclerotic heart disease of native coronary artery without angina pectoris: Secondary | ICD-10-CM | POA: Diagnosis not present

## 2018-12-08 DIAGNOSIS — E1165 Type 2 diabetes mellitus with hyperglycemia: Secondary | ICD-10-CM | POA: Diagnosis not present

## 2018-12-08 DIAGNOSIS — E669 Obesity, unspecified: Secondary | ICD-10-CM | POA: Diagnosis not present

## 2018-12-08 DIAGNOSIS — I10 Essential (primary) hypertension: Secondary | ICD-10-CM | POA: Diagnosis not present

## 2018-12-08 DIAGNOSIS — R1013 Epigastric pain: Secondary | ICD-10-CM | POA: Diagnosis not present

## 2018-12-08 DIAGNOSIS — N289 Disorder of kidney and ureter, unspecified: Secondary | ICD-10-CM | POA: Diagnosis not present

## 2018-12-21 DIAGNOSIS — I2510 Atherosclerotic heart disease of native coronary artery without angina pectoris: Secondary | ICD-10-CM | POA: Diagnosis not present

## 2018-12-21 DIAGNOSIS — I10 Essential (primary) hypertension: Secondary | ICD-10-CM | POA: Diagnosis not present

## 2018-12-21 DIAGNOSIS — R600 Localized edema: Secondary | ICD-10-CM | POA: Diagnosis not present

## 2018-12-21 DIAGNOSIS — Z951 Presence of aortocoronary bypass graft: Secondary | ICD-10-CM | POA: Diagnosis not present

## 2019-01-01 DIAGNOSIS — N390 Urinary tract infection, site not specified: Secondary | ICD-10-CM | POA: Diagnosis not present

## 2019-01-01 DIAGNOSIS — I10 Essential (primary) hypertension: Secondary | ICD-10-CM | POA: Diagnosis not present

## 2019-01-01 DIAGNOSIS — E119 Type 2 diabetes mellitus without complications: Secondary | ICD-10-CM | POA: Diagnosis not present

## 2019-01-03 ENCOUNTER — Other Ambulatory Visit: Payer: Self-pay | Admitting: Internal Medicine

## 2019-01-03 DIAGNOSIS — N183 Chronic kidney disease, stage 3 unspecified: Secondary | ICD-10-CM

## 2019-01-09 ENCOUNTER — Ambulatory Visit
Admission: RE | Admit: 2019-01-09 | Discharge: 2019-01-09 | Disposition: A | Discharge status: Home / Self Care | Payer: Medicare Other | Source: Ambulatory Visit | Attending: Internal Medicine | Admitting: Internal Medicine

## 2019-01-09 DIAGNOSIS — N183 Chronic kidney disease, stage 3 unspecified: Secondary | ICD-10-CM

## 2019-01-11 DIAGNOSIS — R600 Localized edema: Secondary | ICD-10-CM | POA: Diagnosis not present

## 2019-01-11 DIAGNOSIS — I10 Essential (primary) hypertension: Secondary | ICD-10-CM | POA: Diagnosis not present

## 2019-01-12 DIAGNOSIS — M1812 Unilateral primary osteoarthritis of first carpometacarpal joint, left hand: Secondary | ICD-10-CM | POA: Diagnosis not present

## 2019-01-12 DIAGNOSIS — M25532 Pain in left wrist: Secondary | ICD-10-CM | POA: Diagnosis not present

## 2019-01-12 DIAGNOSIS — I517 Cardiomegaly: Secondary | ICD-10-CM | POA: Diagnosis not present

## 2019-01-12 DIAGNOSIS — R0609 Other forms of dyspnea: Secondary | ICD-10-CM | POA: Diagnosis not present

## 2019-01-12 DIAGNOSIS — M797 Fibromyalgia: Secondary | ICD-10-CM | POA: Diagnosis not present

## 2019-01-12 DIAGNOSIS — M25531 Pain in right wrist: Secondary | ICD-10-CM | POA: Diagnosis not present

## 2019-01-12 DIAGNOSIS — M1811 Unilateral primary osteoarthritis of first carpometacarpal joint, right hand: Secondary | ICD-10-CM | POA: Diagnosis not present

## 2019-01-12 DIAGNOSIS — I10 Essential (primary) hypertension: Secondary | ICD-10-CM | POA: Diagnosis not present

## 2019-01-12 DIAGNOSIS — M2550 Pain in unspecified joint: Secondary | ICD-10-CM | POA: Diagnosis not present

## 2019-01-15 DIAGNOSIS — M47812 Spondylosis without myelopathy or radiculopathy, cervical region: Secondary | ICD-10-CM | POA: Diagnosis not present

## 2019-01-15 DIAGNOSIS — M797 Fibromyalgia: Secondary | ICD-10-CM | POA: Diagnosis not present

## 2019-01-18 DIAGNOSIS — Z951 Presence of aortocoronary bypass graft: Secondary | ICD-10-CM | POA: Diagnosis not present

## 2019-01-18 DIAGNOSIS — I10 Essential (primary) hypertension: Secondary | ICD-10-CM | POA: Diagnosis not present

## 2019-01-18 DIAGNOSIS — I2510 Atherosclerotic heart disease of native coronary artery without angina pectoris: Secondary | ICD-10-CM | POA: Diagnosis not present

## 2019-01-18 DIAGNOSIS — R809 Proteinuria, unspecified: Secondary | ICD-10-CM | POA: Diagnosis not present

## 2019-01-18 DIAGNOSIS — E119 Type 2 diabetes mellitus without complications: Secondary | ICD-10-CM | POA: Diagnosis not present

## 2019-01-18 DIAGNOSIS — N183 Chronic kidney disease, stage 3 (moderate): Secondary | ICD-10-CM | POA: Diagnosis not present

## 2019-01-22 DIAGNOSIS — I10 Essential (primary) hypertension: Secondary | ICD-10-CM | POA: Diagnosis not present

## 2019-01-22 DIAGNOSIS — E1165 Type 2 diabetes mellitus with hyperglycemia: Secondary | ICD-10-CM | POA: Diagnosis not present

## 2019-01-22 DIAGNOSIS — E669 Obesity, unspecified: Secondary | ICD-10-CM | POA: Diagnosis not present

## 2019-01-22 DIAGNOSIS — R1013 Epigastric pain: Secondary | ICD-10-CM | POA: Diagnosis not present

## 2019-01-22 DIAGNOSIS — I2510 Atherosclerotic heart disease of native coronary artery without angina pectoris: Secondary | ICD-10-CM | POA: Diagnosis not present

## 2019-02-12 DIAGNOSIS — E119 Type 2 diabetes mellitus without complications: Secondary | ICD-10-CM | POA: Diagnosis not present

## 2019-02-12 DIAGNOSIS — N390 Urinary tract infection, site not specified: Secondary | ICD-10-CM | POA: Diagnosis not present

## 2019-02-12 DIAGNOSIS — I10 Essential (primary) hypertension: Secondary | ICD-10-CM | POA: Diagnosis not present

## 2019-03-16 DIAGNOSIS — E113293 Type 2 diabetes mellitus with mild nonproliferative diabetic retinopathy without macular edema, bilateral: Secondary | ICD-10-CM | POA: Diagnosis not present

## 2019-03-16 DIAGNOSIS — H4323 Crystalline deposits in vitreous body, bilateral: Secondary | ICD-10-CM | POA: Diagnosis not present

## 2019-04-23 DIAGNOSIS — Z1389 Encounter for screening for other disorder: Secondary | ICD-10-CM | POA: Diagnosis not present

## 2019-04-23 DIAGNOSIS — E669 Obesity, unspecified: Secondary | ICD-10-CM | POA: Diagnosis not present

## 2019-04-23 DIAGNOSIS — Z5181 Encounter for therapeutic drug level monitoring: Secondary | ICD-10-CM | POA: Diagnosis not present

## 2019-04-23 DIAGNOSIS — I10 Essential (primary) hypertension: Secondary | ICD-10-CM | POA: Diagnosis not present

## 2019-04-23 DIAGNOSIS — Z01118 Encounter for examination of ears and hearing with other abnormal findings: Secondary | ICD-10-CM | POA: Diagnosis not present

## 2019-04-23 DIAGNOSIS — E1165 Type 2 diabetes mellitus with hyperglycemia: Secondary | ICD-10-CM | POA: Diagnosis not present

## 2019-04-23 DIAGNOSIS — R1013 Epigastric pain: Secondary | ICD-10-CM | POA: Diagnosis not present

## 2019-04-23 DIAGNOSIS — Z1329 Encounter for screening for other suspected endocrine disorder: Secondary | ICD-10-CM | POA: Diagnosis not present

## 2019-04-23 DIAGNOSIS — I2510 Atherosclerotic heart disease of native coronary artery without angina pectoris: Secondary | ICD-10-CM | POA: Diagnosis not present

## 2019-05-18 ENCOUNTER — Encounter: Payer: Self-pay | Admitting: Neurology

## 2019-05-25 DIAGNOSIS — Z1389 Encounter for screening for other disorder: Secondary | ICD-10-CM | POA: Diagnosis not present

## 2019-05-25 DIAGNOSIS — Z01118 Encounter for examination of ears and hearing with other abnormal findings: Secondary | ICD-10-CM | POA: Diagnosis not present

## 2019-05-25 DIAGNOSIS — Z5181 Encounter for therapeutic drug level monitoring: Secondary | ICD-10-CM | POA: Diagnosis not present

## 2019-05-25 DIAGNOSIS — R55 Syncope and collapse: Secondary | ICD-10-CM | POA: Diagnosis not present

## 2019-05-25 DIAGNOSIS — E1165 Type 2 diabetes mellitus with hyperglycemia: Secondary | ICD-10-CM | POA: Diagnosis not present

## 2019-05-25 DIAGNOSIS — R1013 Epigastric pain: Secondary | ICD-10-CM | POA: Diagnosis not present

## 2019-05-25 DIAGNOSIS — E669 Obesity, unspecified: Secondary | ICD-10-CM | POA: Diagnosis not present

## 2019-05-25 DIAGNOSIS — I2510 Atherosclerotic heart disease of native coronary artery without angina pectoris: Secondary | ICD-10-CM | POA: Diagnosis not present

## 2019-05-25 DIAGNOSIS — I10 Essential (primary) hypertension: Secondary | ICD-10-CM | POA: Diagnosis not present

## 2019-05-25 DIAGNOSIS — Z1329 Encounter for screening for other suspected endocrine disorder: Secondary | ICD-10-CM | POA: Diagnosis not present

## 2019-05-25 DIAGNOSIS — Z0001 Encounter for general adult medical examination with abnormal findings: Secondary | ICD-10-CM | POA: Diagnosis not present

## 2019-05-30 ENCOUNTER — Ambulatory Visit (INDEPENDENT_AMBULATORY_CARE_PROVIDER_SITE_OTHER): Payer: Medicare Other | Admitting: Cardiology

## 2019-05-30 ENCOUNTER — Other Ambulatory Visit: Payer: Self-pay

## 2019-05-30 ENCOUNTER — Encounter: Payer: Self-pay | Admitting: Cardiology

## 2019-05-30 VITALS — BP 136/87 | HR 98 | Ht 65.0 in | Wt 212.0 lb

## 2019-05-30 DIAGNOSIS — I2510 Atherosclerotic heart disease of native coronary artery without angina pectoris: Secondary | ICD-10-CM

## 2019-05-30 DIAGNOSIS — R55 Syncope and collapse: Secondary | ICD-10-CM | POA: Diagnosis not present

## 2019-05-30 DIAGNOSIS — I25118 Atherosclerotic heart disease of native coronary artery with other forms of angina pectoris: Secondary | ICD-10-CM | POA: Insufficient documentation

## 2019-05-30 DIAGNOSIS — E782 Mixed hyperlipidemia: Secondary | ICD-10-CM | POA: Diagnosis not present

## 2019-05-30 NOTE — Progress Notes (Signed)
Virtual Visit via Video Note   Subjective:   Holly Nichols, female    DOB: 1951-04-19, 68 y.o.   MRN: [id number]   I connected with the patient on 05/30/19 by a video enabled telemedicine application and verified that I am speaking with the correct person using two identifiers.     I discussed the limitations of evaluation and management by telemedicine and the availability of in person appointments. The patient expressed understanding and agreed to proceed.   This visit type was conducted due to national recommendations for restrictions regarding the COVID-19 Pandemic (e.g. social distancing).  This format is felt to be most appropriate for this patient at this time.  All issues noted in this document were discussed and addressed.  No physical exam was performed (except for noted visual exam findings with Tele health visits).  The patient has consented to conduct a Tele health visit and understands insurance will be billed.     Chief complaint:  Syncope   HPI  68 y/o [place] American female with coronary artery disease s/p CABG and PCI's, controlled hypertension and hyperlipidemia, after diabetes mellitus, fibromyalgia, migraine headaches.  Patient had an episode of syncope on 05/21/2019. Patient had been outside around 3 pm after having had her meal earlier. She was going up 3 brick stairs to her house. She hit the top step and had sudden syncope with no warning signs such as lightheadedness, dizziness, chest pain, palpitations, shortness of breath. She lost consciousness and fell in the bushes. She is unsure how long she was unconsciousness, but guesses it may not have bene for long, as her cousin who was inside the house at that came out looking for her. Patient did not lose bowel, bladder control and was not confused when she awoke. Patient reports similar episode also occured din 01/2019. She did not have any known major injuries with both the episodes and did not seek immediate  medical care.   At baseline, she is not having any exertional chest pain/dyspnea.orthopnea/PND/palpitations symptoms. She only has occasional leg swelling. She is compliant with her medical therapy. She does not routinely check her blood pressure and is unable to find her BP monitor today.   Past Medical History:  Diagnosis Date  . Coronary artery disease   . Diabetes mellitus without complication ([place])   . Fibromyalgia   . 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 Hyperlipidemia   . Hypertension   . Rosacea      History reviewed. No pertinent surgical history.   Social History   Socioeconomic History  . Marital status: Single    Spouse name: Not on file  . Number of children: Not on file  . Years of education: Not on file  . Highest education level: Not on file  Occupational History  . Not on file  Social Needs  . Financial resource strain: Not on file  . Food insecurity:    Worry: Not on file    Inability: Not on file  . Transportation needs:    Medical: Not on file    Non-medical: Not on file  Tobacco Use  . Smoking status: Not on file  Substance and Sexual Activity  . Alcohol use: Not on file  . Drug use: Not on file  . Sexual activity: Not on file  Lifestyle  . Physical activity:    Days per week: Not on file    Minutes per session: Not on file  . Stress: Not on  file  Relationships  . Social connections:    Talks on phone: Not on file    Gets together: Not on file    Attends religious service: Not on file    Active member of club or organization: Not on file    Attends meetings of clubs or organizations: Not on file    Relationship status: Not on file  . Intimate partner violence:    Fear of current or ex partner: Not on file    Emotionally abused: Not on file    Physically abused: Not on file    Forced sexual activity: Not on file  Other Topics Concern  . Not on file  Social History Narrative  . Not on file     Family History   Problem Relation Age of Onset  . Stroke Mother   . Lung cancer Mother   . Hypertension Mother   . Heart attack Father   . Diabetes Father   . Diabetes Sister   . Diabetes Brother   . Heart failure Brother   . Osteoporosis Sister   . Hypertension Sister   . Hyperlipidemia Sister   . Arthritis Sister      Current Outpatient Medications on File Prior to Visit  Medication Sig Dispense Refill  . aspirin 81 MG EC tablet Enteric Coated Aspirin 81 mg tablet,delayed release  Take 1 tablet every day by oral route.    . Biotin 10000 MCG TABS biotin  Take one tablet by mouth daily.    . Cholecalciferol (VITAMIN D3) 125 MCG (5000 UT) TBDP Take by mouth.    . clobetasol (TEMOVATE) 0.05 % external solution clobetasol 0.05 % scalp solution    . clopidogrel (PLAVIX) 75 MG tablet clopidogrel 75 mg tablet  TAKE 1 TABLET EVERY DAY    . Cyanocobalamin 5000 MCG CAPS Take by mouth.    . cyclobenzaprine (FLEXERIL) 10 MG tablet daily.    . finasteride (PROSCAR) 5 MG tablet 1/2 tab daily    . fluticasone (FLONASE) 50 MCG/ACT nasal spray Place into the nose as needed.    . furosemide (LASIX) 40 MG tablet Take by mouth daily.    . Insulin Glargine (LANTUS SOLOSTAR) 100 UNIT/ML Solostar Pen 25 Units 2 (two) times a day.    . isosorbide dinitrate (ISORDIL) 30 MG tablet Take by mouth at bedtime.    Marland Kitchen ketoconazole (NIZORAL) 2 % shampoo 3x weekly    . lidocaine-prilocaine (EMLA) cream daily.    . metoprolol succinate (TOPROL-XL) 50 MG 24 hr tablet Take 50 mg by mouth daily.     . metroNIDAZOLE (METROCREAM) 0.75 % cream daily.    . Multiple Vitamins-Minerals (MULTIVITAMIN ADULTS 50+ PO) daily.    . nitroGLYCERIN (NITROSTAT) 0.4 MG SL tablet as needed.    . pantoprazole (PROTONIX) 40 MG tablet Take 40 mg by mouth daily.    Marland Kitchen REPATHA SURECLICK 993 MG/ML SOAJ [id number]    . acyclovir (ZOVIRAX) 400 MG tablet as needed.    Marland Kitchen glucagon (GLUCAGON EMERGENCY) 1 MG injection Glucagon Emergency Kit (human-recomb) 1 mg  solution for injection  Take by injection route as needed for 30 days.     No current facility-administered medications on file prior to visit.     Cardiovascular studies:  Echocardiogram 01/11/2019: Left ventricle cavity is normal in size. Mild concentric hypertrophy of the left ventricle. Normal global wall motion. Doppler evidence of grade I (impaired) diastolic dysfunction, normal LAP. Calculated EF 67%. Left atrial cavity is normal in size. Aneurysmal  interatrial septum without 2D or color Doppler evidence of interatrial shunt.  Mild mitral valve leaflet thickening. Mild to moderate mitral regurgitation. Mild tricuspid regurgitation. Inadequate TR jet to estimate PA systolic pressure. Normal right atrial pressure.   Coronary angiography 12/09/2017: Indication: Non-STEMI Severe native left main/LAD/left circumflex disease, functionally occluded Patent LIMA to LAD, diffuse disease in native LAD distal to anastomosis Patent SVG-diagonal-OM (?) Severe proximal RCA stenosis proximal to previous stent, and moderate in-stent restenosis (previous 3.0X23 mm BMS)---> successful PCI to RCA with 2 overlapping stents Xience 3.25 x 38 mm, Xience 3.25 X 18 mm DES  Recent labs: 12/04/2018: Glucose132, BUN/creatinine 16.  EGFR 55.  Sodium 140, potassium 50.  Rest the CMP normal. H/H 13/41.  MCV 86.  Platelets 316 Hemoglobin A1c 7.5% Cholesterol 190, triglycerides 98, HDL 72, LDL 98.   Review of Systems  Constitution: Negative for decreased appetite, malaise/fatigue, weight gain and weight loss.  HENT: Negative for congestion.   Eyes: Negative for visual disturbance.  Cardiovascular: Positive for syncope. Negative for chest pain, dyspnea on exertion, leg swelling and palpitations.  Respiratory: Negative for cough.   Endocrine: Negative for cold intolerance.  Hematologic/Lymphatic: Does not bruise/bleed easily.  Skin: Negative for itching and rash.  Musculoskeletal: Negative for myalgias.   Gastrointestinal: Negative for abdominal pain, nausea and vomiting.  Genitourinary: Negative for dysuria.  Neurological: Negative for dizziness and weakness.  Psychiatric/Behavioral: The patient is not nervous/anxious.   All other systems reviewed and are negative.        Vitals:   05/30/19 0939  BP: 136/87  Pulse: 98   (Measured by the patient using a home BP monitor)  Body mass index is 35.28 kg/m. Filed Weights   05/30/19 0939  Weight: 96.2 kg     Observation/findings during video visit   Objective:    Physical Exam  Constitutional: She is oriented to person, place, and time. She appears well-developed and well-nourished. No distress.  Pulmonary/Chest: Effort normal.  Neurological: She is alert and oriented to person, place, and time.  Psychiatric: She has a normal mood and affect.  Nursing note and vitals reviewed.         Assessment & Recommendations:   68 y/o [place] female with coronary artery disease s/p CABG and PCI's, controlled hypertension and hyperlipidemia, after diabetes mellitus, fibromyalgia, migraine headaches.  Syncope: Sudden syncope with no warning signs in a patient with known cardiac history is concerning for arrhthymias. I will arrange office visit to check orthostatic vitals and place on an event monitor.   CAD: S/p prior CABG and PCI's. Currently stable with no angina symptoms. On appropriate medical therapy. In absence of any bleeding issues, recommend continuing long-term dual antiplatelet therapy in the event of multiple prior revascularization. Continue isosorbide mononitrate 30 mg, metoprolol succinate 25 mg, Repatha  Mild MR: Cliically stable.  Hypertension: Controlled  Hyperlipidemia: Continue Repatha.  Type 2 DM: Management as per PCP. She was reportedly taken off Jardiance due to her renal function    Nigel Mormon, MD [facility] Pager: [phone] Office: [phone] If no  answer Cell [phone]

## 2019-05-31 NOTE — Progress Notes (Signed)
New Patient Virtual Visit via Video Note The purpose of this virtual visit is to provide medical care while limiting exposure to the novel coronavirus.    Consent was obtained for video visit:  Yes.   Answered questions that patient had about telehealth interaction:  Yes.   I discussed the limitations, risks, security and privacy concerns of performing an evaluation and management service by telemedicine. I also discussed with the patient that there may be a patient responsible charge related to this service. The patient expressed understanding and agreed to proceed.  Pt location: Home Physician Location: office Name of referring provider:  Tapp, Dannielle Burn, MD I connected with Holly Nichols at patients initiation/request on 06/01/2019 at  2:50 PM EDT by video enabled telemedicine application and verified that I am speaking with the correct person using two identifiers. Pt MRN:  [id number] Pt DOB:  01-16-1951 Video Participants:  Holly Nichols    History of Present Illness: Holly Nichols is a 68 y.o. ambidextrous-handed female with CAD s/p CABG and prior PCI/stent, OSA, chronic low back pain (followed by pain management) fibromyalgia, hyperlipidemia, and type 2 diabetes mellitus presenting for evaluation of dizziness.  She moved from [place] to [facility] in November 2019 and is establishing care with various providers.   Patient reports having spells of passing out, not dizziness, for the past 4 to 5 years.  Passing out spells were occurring about once per year, but she reports 2 distinct episodes in 2020.  Most recently, last week she was climbing the steps into her home and collapsed, falling into the bushes.  She is not aware of how long she passed out, reporting approximately up to 15 minutes.  Prior to this, she fainted on 2/14, which lasted a few seconds.  She was escorted to lay down and again had a similar spell lasting a few seconds.  All these episodes have occurred when she it standing or  walking.  She has not had any at rest. There is no warning or triggers.  She denies chest pain, palpitation, vision changes.  Sometimes, she feels that neck rotation to the right can trigger it.  She has not had a recent US carotids. There is no abnormal movements, tongue biting, or urinary/bowel incontinence.   She is retired Passenger transport manager for [employer].  She lives with cousin. She has two grown children.    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   aspirin 81 MG EC tablet Enteric Coated Aspirin 81 mg tablet,delayed release  Take 1 tablet  every day by oral route.   Biotin 10000 MCG TABS biotin  Take one tablet by mouth daily.   Cholecalciferol (VITAMIN D3) 125 MCG (5000 UT) TBDP Take by mouth.   clobetasol (TEMOVATE) 0.05 % external solution clobetasol 0.05 % scalp solution   clopidogrel (PLAVIX) 75 MG tablet clopidogrel 75 mg tablet  TAKE 1 TABLET EVERY DAY   Cyanocobalamin 5000 MCG CAPS Take by mouth.   cyclobenzaprine (FLEXERIL) 10 MG tablet daily.   finasteride (PROSCAR) 5 MG tablet 1/2 tab daily   fluticasone  (FLONASE) 50 MCG/ACT nasal spray Place into the nose as needed.   furosemide (LASIX) 40 MG tablet Take by mouth daily.   glucagon (GLUCAGON EMERGENCY) 1 MG injection Glucagon Emergency Kit (human-recomb) 1 mg solution for injection  Take by injection route as needed for 30 days.   Insulin Glargine (LANTUS SOLOSTAR) 100 UNIT/ML Solostar Pen 25 Units 2 (two) times a day.   isosorbide dinitrate (ISORDIL) 30 MG tablet Take by mouth at bedtime.   ketoconazole (NIZORAL) 2 % shampoo 3x weekly   lidocaine-prilocaine (EMLA) cream daily.   metoprolol succinate (TOPROL-XL) 50 MG 24 hr tablet Take 50 mg by mouth daily.    metroNIDAZOLE (METROCREAM) 0.75 % cream daily.   Multiple Vitamins-Minerals (MULTIVITAMIN ADULTS 50+ PO) daily.   nitroGLYCERIN (NITROSTAT) 0.4 MG SL tablet as needed.   pantoprazole (PROTONIX) 40 MG tablet Take 40 mg by mouth daily.   REPATHA SURECLICK 379 MG/ML SOAJ [id number]   No facility-administered encounter medications on file as of 06/01/2019.     Allergies:  Allergies  Allergen Reactions   Acetaminophen Other (See Comments)    Heart palpitations   Aspirin Other (See Comments)    Stomach ulcers   Codeine Other (See Comments)   Hydroxychloroquine Other (See Comments)   Hydroxychloroquine Sulfate    Lithium Other (See Comments)   Morphine Nausea Only, Nausea And Vomiting and Other (See Comments)   Nortriptyline Other (See Comments)   Nsaids Other (See Comments)    Heart palpitations   Penicillin G Hives   Prednisone Other (See Comments)    Heart palpation    Statins Other (See Comments)   Tape Other (See Comments)   Tramadol Other (See Comments)    Heart palpatations   Lisinopril Rash   Thimerosal Rash    Family History: Family History  Problem Relation Age of Onset   Stroke Mother    Lung cancer Mother    Hypertension Mother    Heart attack Father    Diabetes Father    Diabetes Sister    Diabetes Brother    Heart  failure Brother    Osteoporosis Sister    Hypertension Sister    Hyperlipidemia Sister    Arthritis Sister     Social History: Social History   Tobacco Use   Smoking status: Never Smoker   Smokeless tobacco: Never Used  Substance Use Topics   Alcohol use: Yes    Comment: rarely   Drug use: Not on file   Social History   Social History Narrative   Lives with cousin      Steps inside of home      Right and left handed      Highest level of education- bachelors degree      Retired from [employer]ician, dispensing       Review of Systems:  CONSTITUTIONAL: No fevers, chills, night sweats, or weight loss.   EYES: No visual changes or eye pain ENT: No hearing  changes.  No history of nose bleeds.   RESPIRATORY: No cough, wheezing and shortness of breath.   CARDIOVASCULAR: Negative for chest pain, and palpitations.   GI: Negative for abdominal discomfort, blood in stools or black stools.  No recent change in bowel habits.   GU:  No history of incontinence.   MUSCLOSKELETAL: No history of joint pain or swelling.  No myalgias.   SKIN: Negative for lesions, rash, and itching.   HEMATOLOGY/ONCOLOGY: Negative for prolonged bleeding, bruising easily, and swollen nodes.  No history of cancer.   ENDOCRINE: Negative for cold or heat intolerance, polydipsia or goiter.   PSYCH:  No depression or anxiety symptoms.   NEURO: As Above.   Vital Signs:  Ht _0  (1.651 m)    Wt 212 lb (96.2 kg)    BMI 35.28 kg/m    General Medical Exam:  Well appearing, comfortable.  Nonlabored breathing.  No deformity or edema.  No rash.  Neurological Exam: MENTAL STATUS including orientation to time, place, person, recent and remote memory, attention span and concentration, language, and fund of knowledge is normal.  Speech is not dysarthric.  CRANIAL NERVES:  Normal conjugate, extra-ocular eye movements in all directions of gaze.  No ptosis.  Normal facial symmetry and movements.  Normal shoulder  shrug and head rotation.  Tongue is midline.  MOTOR:  Antigravity in all extremities.  No abnormal movements.  No pronator drift.    SENSORY:  Romberg's sign positive  COORDINATION/GAIT: Normal finger to nose bilaterally.  Intact rapid alternating movements bilaterally.  Able to rise from a chair without using arms.  Gait narrow based and stable.    IMPRESSION: 1. Syncope, unclear etiology.  She has significant cardiac history and is certainly at risk to develop cardiac arrhythmias.  There is nothing by her history which suggests these are seizures.  I will bring her to the office for routine EEG, but my suspicion is very low.  She will be seeing cardiology next week and recommend cardiac monitor and US carotids.   2. 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  Her exam is limited by virtual visit.  If her cardiac work-up is negative and she continues to have imbalance, patient will return to the office for exam and evaluation  Follow Up Instructions:  I discussed the assessment and treatment plan with the patient. The patient was provided an opportunity to ask questions and all were answered. The patient agreed with the plan and demonstrated an understanding of the instructions.   The patient was advised to call back or seek an in-person evaluation if the symptoms worsen or if the condition fails to improve as anticipated.  Total Time spent:  45 min   Alda Berthold, DO

## 2019-06-01 ENCOUNTER — Telehealth (INDEPENDENT_AMBULATORY_CARE_PROVIDER_SITE_OTHER): Payer: Medicare Other | Admitting: Neurology

## 2019-06-01 ENCOUNTER — Other Ambulatory Visit: Payer: Self-pay

## 2019-06-01 ENCOUNTER — Encounter: Payer: Self-pay | Admitting: Neurology

## 2019-06-01 VITALS — Ht 65.0 in | Wt 212.0 lb

## 2019-06-01 DIAGNOSIS — R55 Syncope and collapse: Secondary | ICD-10-CM | POA: Diagnosis not present

## 2019-06-01 DIAGNOSIS — R278 Other lack of coordination: Secondary | ICD-10-CM

## 2019-06-04 ENCOUNTER — Other Ambulatory Visit: Payer: Self-pay

## 2019-06-04 DIAGNOSIS — R55 Syncope and collapse: Secondary | ICD-10-CM

## 2019-06-04 DIAGNOSIS — R278 Other lack of coordination: Secondary | ICD-10-CM

## 2019-06-06 ENCOUNTER — Other Ambulatory Visit: Payer: Self-pay

## 2019-06-06 ENCOUNTER — Ambulatory Visit: Payer: Medicare Other

## 2019-06-06 DIAGNOSIS — R55 Syncope and collapse: Secondary | ICD-10-CM

## 2019-06-08 ENCOUNTER — Ambulatory Visit: Payer: Medicare Other | Admitting: Cardiology

## 2019-06-13 DIAGNOSIS — E1165 Type 2 diabetes mellitus with hyperglycemia: Secondary | ICD-10-CM | POA: Diagnosis not present

## 2019-06-13 DIAGNOSIS — I2510 Atherosclerotic heart disease of native coronary artery without angina pectoris: Secondary | ICD-10-CM | POA: Diagnosis not present

## 2019-06-13 DIAGNOSIS — R1013 Epigastric pain: Secondary | ICD-10-CM | POA: Diagnosis not present

## 2019-06-13 DIAGNOSIS — E669 Obesity, unspecified: Secondary | ICD-10-CM | POA: Diagnosis not present

## 2019-06-13 DIAGNOSIS — I10 Essential (primary) hypertension: Secondary | ICD-10-CM | POA: Diagnosis not present

## 2019-06-19 ENCOUNTER — Encounter: Payer: Self-pay | Admitting: *Deleted

## 2019-06-19 ENCOUNTER — Encounter: Payer: Self-pay | Admitting: Neurology

## 2019-06-19 ENCOUNTER — Telehealth (INDEPENDENT_AMBULATORY_CARE_PROVIDER_SITE_OTHER): Payer: Medicare Other | Admitting: Neurology

## 2019-06-19 ENCOUNTER — Telehealth: Payer: Self-pay | Admitting: Neurology

## 2019-06-19 DIAGNOSIS — G43709 Chronic migraine without aura, not intractable, without status migrainosus: Secondary | ICD-10-CM | POA: Diagnosis not present

## 2019-06-19 DIAGNOSIS — IMO0002 Reserved for concepts with insufficient information to code with codable children: Secondary | ICD-10-CM | POA: Insufficient documentation

## 2019-06-19 MED ORDER — TOPIRAMATE 100 MG PO TABS: 100.0000 mg | ORAL_TABLET | Freq: Every day | ORAL | 11 refills | Status: DC

## 2019-06-19 MED ORDER — AIMOVIG 70 MG/ML ~~LOC~~ SOAJ: 70.0000 mg | SUBCUTANEOUS | 11 refills | Status: DC

## 2019-06-19 MED ORDER — BUTALBITAL-APAP-CAFFEINE 50-325-40 MG PO TABS: 1.0000 | ORAL_TABLET | Freq: Four times a day (QID) | ORAL | 3 refills | Status: DC | PRN

## 2019-06-19 NOTE — Progress Notes (Signed)
PATIENT: Holly Nichols DOB: 10/31/1951  Virtual Visit via video  I connected with Bernerd Limbo on 06/19/19 at  by video and verified that I am speaking with the correct person using two identifiers.   I discussed the limitations, risks, security and privacy concerns of performing an evaluation and management service by video and the availability of in person appointments. I also discussed with the patient that there may be a patient responsible charge related to this service. The patient expressed understanding and agreed to proceed.  HISTORICAL  Holly Nichols is a 68 years old female, seen in request by her primary care physician Dr. Benito Mccreedy for evaluation of headache, initial evaluations was through virtual visit on June 19 2019.  I have reviewed and summarized the referring note from the referring physician. She has PMHx of DM since 2996, CAD, CABG, chronic migraine.   She recently moved from [place] to [place] from Nov 2019, previously she was under the care of neurologist for migraine headaches and low back pain. She was given Topamax and Depakote, which has a migraine headache under reasonable control, she was having migraines about 2-3 times each month,  Since she moved to [place], her Topamax and Depakote was stopped, she now have increased migraine headaches, 2-3 times each week, she complains of right lateralized severe pounding headache with associated light noise sensitivity, nauseous, lasting for hours to days, she has tried Tylenol Aleve Advil, which has limited help for her headaches  She also complains of worsening low back pain, is using lidocaine patch, heating pad  Observations/Objective: I have reviewed problem lists, medications, allergies.   Awake, alert, oriented to history taking and casual conversation  Assessment and Plan: Chronic migraine headaches  Restart Topamax 100 mg every night as preventive medications,  Fioricet as needed for abortive  treatment  Follow Up Instructions:   In 2 months    I discussed the assessment and treatment plan with the patient. The patient was provided an opportunity to ask questions and all were answered. The patient agreed with the plan and demonstrated an understanding of the instructions.   The patient was advised to call back or seek an in-person evaluation if the symptoms worsen or if the condition fails to improve as anticipated.  I provided 30 minutes of non-face-to-face time during this encounter.  REVIEW OF SYSTEMS: Full 14 system review of systems performed and notable only for as above All other review of systems were negative.  ALLERGIES: Allergies  Allergen Reactions  . Acetaminophen Other (See Comments)    Heart palpitations  . Aspirin Other (See Comments)    Stomach ulcers  . Codeine Other (See Comments)  . Depakote [Divalproex Sodium] Other (See Comments)    Kidney issues  . Hydroxychloroquine Other (See Comments)  . Hydroxychloroquine Sulfate   . Lithium Other (See Comments)  . Morphine Nausea Only, Nausea And Vomiting and Other (See Comments)  . Nortriptyline Other (See Comments)  . Nsaids Other (See Comments)    Heart palpitations  . Penicillin G Hives  . Prednisone Other (See Comments)    Heart palpation   . Statins Other (See Comments)  . Tape Other (See Comments)  . Topiramate Other (See Comments)    Kidney issues  . Tramadol Other (See Comments)    Heart palpatations  . Lisinopril Rash  . Thimerosal Rash    HOME MEDICATIONS: Current Outpatient Medications  Medication Sig Dispense Refill  . acyclovir (ZOVIRAX) 400 MG tablet as needed.    Marland Kitchen  aspirin 81 MG EC tablet Enteric Coated Aspirin 81 mg tablet,delayed release  Take 1 tablet every day by oral route.    . Biotin 10000 MCG TABS biotin  Take one tablet by mouth daily.    . Cholecalciferol (VITAMIN D3) 125 MCG (5000 UT) TBDP Take by mouth.    . clobetasol (TEMOVATE) 0.05 % external solution  clobetasol 0.05 % scalp solution    . clopidogrel (PLAVIX) 75 MG tablet clopidogrel 75 mg tablet  TAKE 1 TABLET EVERY DAY    . Cyanocobalamin 5000 MCG CAPS Take by mouth.    . cyclobenzaprine (FLEXERIL) 10 MG tablet Take 10 mg by mouth daily.     Eduard Roux (AIMOVIG) 70 MG/ML SOAJ Inject 70 mg into the skin every 30 (thirty) days.    . finasteride (PROSCAR) 5 MG tablet 1/2 tab daily    . fluticasone (FLONASE) 50 MCG/ACT nasal spray Place into the nose as needed.    . furosemide (LASIX) 40 MG tablet Take by mouth daily.    Marland Kitchen glucagon (GLUCAGON EMERGENCY) 1 MG injection Glucagon Emergency Kit (human-recomb) 1 mg solution for injection  Take by injection route as needed for 30 days.    . Insulin Glargine (LANTUS SOLOSTAR) 100 UNIT/ML Solostar Pen 25 Units 2 (two) times a day.    . isosorbide dinitrate (ISORDIL) 30 MG tablet Take by mouth at bedtime.    Marland Kitchen ketoconazole (NIZORAL) 2 % shampoo 3x weekly    . lidocaine-prilocaine (EMLA) cream Apply topically daily.     . meloxicam (MOBIC) 15 MG tablet Take 1 tablet by mouth daily.    . metoprolol succinate (TOPROL-XL) 50 MG 24 hr tablet Take 50 mg by mouth daily.     . metroNIDAZOLE (METROCREAM) 0.75 % cream Apply 1 application topically daily.     . Multiple Vitamins-Minerals (MULTIVITAMIN ADULTS 50+ PO) daily.    . nitroGLYCERIN (NITROSTAT) 0.4 MG SL tablet as needed.    . pantoprazole (PROTONIX) 40 MG tablet Take 40 mg by mouth daily.    Marland Kitchen REPATHA SURECLICK 165 MG/ML SOAJ [id number]     No current facility-administered medications for this visit.     PAST MEDICAL HISTORY: Past Medical History:  Diagnosis Date  . CKD (chronic kidney disease)   . Coronary artery disease   . Diabetes mellitus without complication ([place])   . Fibromyalgia   . H/O shoulder surgery 2017   right  . 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 Hyperlipidemia   . Hypertension   . Lumbar pain   .  Migraine   . OSA (obstructive sleep apnea)    failed CPAP therapy in the past  . Rosacea     PAST SURGICAL HISTORY: Past Surgical History:  Procedure Laterality Date  . ANKLE SURGERY Right 2002  . CARDIAC CATHETERIZATION  01/2017   3 Stents  . CORONARY ANGIOPLASTY  01/2007  . CORONARY ARTERY BYPASS GRAFT  2002  . HERNIA REPAIR  2005  . SHOULDER SURGERY Right 2017  . TOTAL VAGINAL HYSTERECTOMY  12/1990  . WRIST SURGERY  2006    FAMILY HISTORY: Family History  Problem Relation Age of Onset  . Stroke Mother   . Lung cancer Mother   . Hypertension Mother   . Heart attack Father   . Diabetes Father   . Diabetes Sister   . Diabetes Brother   . Heart failure Brother   .  Osteoporosis Sister   . Hypertension Sister   . Hyperlipidemia Sister   . Arthritis Sister     SOCIAL HISTORY:   Social History   Socioeconomic History  . Marital status: Divorced    Spouse name: Not on file  . Number of children: 2  . Years of education: college  . Highest education level: Bachelor's degree (e.g., BA, AB, BS)  Occupational History  . Occupation: Retired  Scientific laboratory technician  . Financial resource strain: Not on file  . Food insecurity    Worry: Not on file    Inability: Not on file  . Transportation needs    Medical: Not on file    Non-medical: Not on file  Tobacco Use  . Smoking status: Never Smoker  . Smokeless tobacco: Never Used  Substance and Sexual Activity  . Alcohol use: Yes    Comment: rarely  . Drug use: Never  . Sexual activity: Not on file  Lifestyle  . Physical activity    Days per week: Not on file    Minutes per session: Not on file  . Stress: Not on file  Relationships  . Social Herbalist on phone: Not on file    Gets together: Not on file    Attends religious service: Not on file    Active member of club or organization: Not on file    Attends meetings of clubs or organizations: Not on file    Relationship status: Not on file  . Intimate  partner violence    Fear of current or ex partner: Not on file    Emotionally abused: Not on file    Physically abused: Not on file    Forced sexual activity: Not on file  Other Topics Concern  . Not on file  Social History Narrative   Lives with cousin      Steps inside of home      Right and left handed      Highest level of education- bachelors degree      Retired from [employer]      Occasional use of caffeine.       Marcial Pacas, M.D. Ph.D.  [facility] [address], [place], [place] [address] Ph: [phone] Fax: [phone]  CC: Benito Mccreedy, MD

## 2019-06-19 NOTE — Telephone Encounter (Signed)
Pt gave consent for VV on the phone/ Pt understands that although there may be some limitations with this type of visit, we will take all precautions to reduce any security or privacy concerns.  Pt understands that this will be treated like an in office visit and we will file with pt's insurance, and there may be a patient responsible charge related to this service. °

## 2019-07-19 ENCOUNTER — Ambulatory Visit: Payer: Self-pay | Admitting: Cardiology

## 2019-07-23 DIAGNOSIS — I2510 Atherosclerotic heart disease of native coronary artery without angina pectoris: Secondary | ICD-10-CM | POA: Diagnosis not present

## 2019-07-23 DIAGNOSIS — E669 Obesity, unspecified: Secondary | ICD-10-CM | POA: Diagnosis not present

## 2019-07-23 DIAGNOSIS — R1013 Epigastric pain: Secondary | ICD-10-CM | POA: Diagnosis not present

## 2019-07-23 DIAGNOSIS — I10 Essential (primary) hypertension: Secondary | ICD-10-CM | POA: Diagnosis not present

## 2019-07-23 DIAGNOSIS — E1165 Type 2 diabetes mellitus with hyperglycemia: Secondary | ICD-10-CM | POA: Diagnosis not present

## 2019-07-31 ENCOUNTER — Telehealth: Payer: Self-pay | Admitting: *Deleted

## 2019-07-31 ENCOUNTER — Other Ambulatory Visit: Payer: Self-pay | Admitting: Neurology

## 2019-07-31 MED ORDER — AIMOVIG 140 MG/ML ~~LOC~~ SOAJ: 140.0000 mg | SUBCUTANEOUS | 11 refills | Status: AC

## 2019-07-31 MED ORDER — CAMBIA 50 MG PO PACK: 50.0000 mg | PACK | ORAL | 5 refills | Status: DC | PRN

## 2019-07-31 NOTE — Telephone Encounter (Signed)
Please give her a follow-up visit with Judson Roch in few weeks,   She is already on aimovig 70 mg every month as migraine prevention, I have increased it to 140 mg every 30 days for better migraine prevention.  [place] prn for abortive treatment

## 2019-07-31 NOTE — Telephone Encounter (Signed)
Email from patient:  Hi Dr Krista Blue, I've been taking the "butalb-acetaminophen-caff" for quite some time and have not experienced any side effects. I keep refilling the prescription but it is only for a 3-day supply. Is there any chance you could rewrite the prescription for a 30-day supply? Feel free to call me if you have any questions or concerns regarding this matter. The pills are helping. Thank you.     Holly Nichols  [phone]

## 2019-07-31 NOTE — Telephone Encounter (Signed)
She is taking #10 Fioricet every three days and has exhausted her refills at the pharmacy.  I discussed with her that Fioricet is to be reserved for her most significant headaches and #10 should really be used over a month's time.  Stated she did not realize the medication should be used minimally.    She has continued taking topiramate 100mg  at bedtime.  She complains of 2-3 migraine days per week.  She has two questions:  1) Is there something else that can be given to reduce her migraine frequency?    2) Can she be allowed a few more tablets of Fioricet to last her throughout the month?

## 2019-08-01 ENCOUNTER — Encounter: Payer: Self-pay | Admitting: Cardiology

## 2019-08-01 ENCOUNTER — Telehealth (INDEPENDENT_AMBULATORY_CARE_PROVIDER_SITE_OTHER): Payer: Medicare Other | Admitting: Cardiology

## 2019-08-01 ENCOUNTER — Other Ambulatory Visit: Payer: Self-pay

## 2019-08-01 VITALS — BP 114/76 | HR 81 | Ht 65.0 in | Wt 198.0 lb

## 2019-08-01 DIAGNOSIS — R55 Syncope and collapse: Secondary | ICD-10-CM

## 2019-08-01 DIAGNOSIS — I2510 Atherosclerotic heart disease of native coronary artery without angina pectoris: Secondary | ICD-10-CM

## 2019-08-01 MED ORDER — BUTALBITAL-APAP-CAFFEINE 50-325-40 MG PO TABS: 1.0000 | ORAL_TABLET | Freq: Four times a day (QID) | ORAL | 5 refills | Status: DC | PRN

## 2019-08-01 NOTE — Addendum Note (Signed)
Addended by: Desmond Lope on: 08/01/2019 09:17 AM   Modules accepted: Orders

## 2019-08-01 NOTE — Addendum Note (Signed)
Addended by: Noberto Retort C on: 08/01/2019 10:26 AM   Modules accepted: Orders

## 2019-08-01 NOTE — Progress Notes (Signed)
Virtual Visit via Video Note   Subjective:   Holly Nichols, female    DOB: 1951/12/04, 68 y.o.   MRN: [id number]   I connected with the patient on 08/01/19 by a video enabled telemedicine application and verified that I am speaking with the correct person using two identifiers.     I discussed the limitations of evaluation and management by telemedicine and the availability of in person appointments. The patient expressed understanding and agreed to proceed.    This visit type was conducted due to national recommendations for restrictions regarding the COVID-19 Pandemic (e.g. social distancing).  This format is felt to be most appropriate for this patient at this time.  All issues noted in this document were discussed and addressed.  No physical exam was performed (except for noted visual exam findings with Tele health visits).  The patient has consented to conduct a Tele health visit and understands insurance will be billed.     Chief complaint:  Syncope   HPI  68 y/o [place] American female with coronary artery disease s/p CABG and PCI's, controlled hypertension and hyperlipidemia, after diabetes mellitus, fibromyalgia, migraine headaches, recurrent syncope.  Patient wore event monitor for a month. While there were several patient triggered episodes, including syncope, there was no causal relation with any specific rhythm abnormality. Unfortunately she continues to have episodes of "passing out". Her las episode occurred while she was in shower. Her eyes were closed. She suddenly turned her head, when she had "whiteout" in front of her eyes and lost consciousness for unclear duration. She did not have any major injury. She has been having these episodes nearly once a month.   Past Medical History:  Diagnosis Date  . CKD (chronic kidney disease)   . Coronary artery disease   . Diabetes mellitus without complication ([place])   . Fibromyalgia   . H/O shoulder surgery 2017   right  .  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 Hyperlipidemia   . Hypertension   . Lumbar pain   . Migraine   . OSA (obstructive sleep apnea)    failed CPAP therapy in the past  . Rosacea      Past Surgical History:  Procedure Laterality Date  . ANKLE SURGERY Right 2002  . CARDIAC CATHETERIZATION  01/2017   3 Stents  . CORONARY ANGIOPLASTY  01/2007  . CORONARY ARTERY BYPASS GRAFT  2002  . HERNIA REPAIR  2005  . SHOULDER SURGERY Right 2017  . TOTAL VAGINAL HYSTERECTOMY  12/1990  . WRIST SURGERY  2006     Social History   Socioeconomic History  . Marital status: Divorced    Spouse name: Not on file  . Number of children: 2  . Years of education: college  . Highest education level: Bachelor's degree (e.g., BA, AB, BS)  Occupational History  . Occupation: Retired  Scientific laboratory technician  . Financial resource strain: Not on file  . Food insecurity    Worry: Not on file    Inability: Not on file  . Transportation needs    Medical: Not on file    Non-medical: Not on file  Tobacco Use  . Smoking status: Never Smoker  . Smokeless tobacco: Never Used  Substance and Sexual Activity  . Alcohol use: Yes    Comment: rarely  . Drug use: Never  . Sexual activity: Not on file  Lifestyle  . Physical  activity    Days per week: Not on file    Minutes per session: Not on file  . Stress: Not on file  Relationships  . Social Herbalist on phone: Not on file    Gets together: Not on file    Attends religious service: Not on file    Active member of club or organization: Not on file    Attends meetings of clubs or organizations: Not on file    Relationship status: Not on file  . Intimate partner violence    Fear of current or ex partner: Not on file    Emotionally abused: Not on file    Physically abused: Not on file    Forced sexual activity: Not on file  Other Topics Concern  . Not on file  Social History  Narrative   Lives with cousin      Steps inside of home      Right and left handed      Highest level of education- bachelors degree      Retired from [employer]      Occasional use of caffeine.        Family History  Problem Relation Age of Onset  . Stroke Mother   . Lung cancer Mother   . Hypertension Mother   . Heart attack Father   . Diabetes Father   . Diabetes Sister   . Diabetes Brother   . Heart failure Brother   . Osteoporosis Sister   . Hypertension Sister   . Hyperlipidemia Sister   . Arthritis Sister      Current Outpatient Medications on File Prior to Visit  Medication Sig Dispense Refill  . acyclovir (ZOVIRAX) 400 MG tablet as needed.    Marland Kitchen aspirin 81 MG EC tablet Enteric Coated Aspirin 81 mg tablet,delayed release  Take 1 tablet every day by oral route.    . Biotin 10000 MCG TABS biotin  Take one tablet by mouth daily.    . butalbital-acetaminophen-caffeine (FIORICET) 50-325-40 MG tablet Take 1 tablet by mouth every 6 (six) hours as needed for headache. Do not take more than 10 tablets per month. 10 tablet 5  . Cholecalciferol (VITAMIN D3) 125 MCG (5000 UT) TBDP Take by mouth.    . clobetasol (TEMOVATE) 0.05 % external solution clobetasol 0.05 % scalp solution    . clopidogrel (PLAVIX) 75 MG tablet clopidogrel 75 mg tablet  TAKE 1 TABLET EVERY DAY    . Cyanocobalamin 5000 MCG CAPS Take by mouth.    . cyclobenzaprine (FLEXERIL) 10 MG tablet Take 10 mg by mouth daily.     Eduard Roux (AIMOVIG) 140 MG/ML SOAJ Inject 140 mg into the skin every 30 (thirty) days. 1 pen 11  . finasteride (PROSCAR) 5 MG tablet 1/2 tab daily    . fluticasone (FLONASE) 50 MCG/ACT nasal spray Place into the nose as needed.    . furosemide (LASIX) 40 MG tablet Take by mouth daily.    Marland Kitchen glucagon (GLUCAGON EMERGENCY) 1 MG injection Glucagon Emergency Kit (human-recomb) 1 mg solution for injection  Take by injection route as needed for 30 days.    . Insulin Glargine  (LANTUS SOLOSTAR) 100 UNIT/ML Solostar Pen 25 Units 2 (two) times a day.    . isosorbide dinitrate (ISORDIL) 30 MG tablet Take by mouth at bedtime.    Marland Kitchen ketoconazole (NIZORAL) 2 % shampoo 3x weekly    . lidocaine-prilocaine (EMLA) cream Apply topically daily.     Marland Kitchen  meloxicam (MOBIC) 15 MG tablet Take 1 tablet by mouth daily.    . metoprolol succinate (TOPROL-XL) 50 MG 24 hr tablet Take 50 mg by mouth daily.     . metroNIDAZOLE (METROCREAM) 0.75 % cream Apply 1 application topically daily.     . Multiple Vitamins-Minerals (MULTIVITAMIN ADULTS 50+ PO) daily.    . nitroGLYCERIN (NITROSTAT) 0.4 MG SL tablet as needed.    . pantoprazole (PROTONIX) 40 MG tablet Take 40 mg by mouth daily.    Marland Kitchen REPATHA SURECLICK 373 MG/ML SOAJ [id number]    . topiramate (TOPAMAX) 100 MG tablet Take 1 tablet (100 mg total) by mouth at bedtime. 30 tablet 11   No current facility-administered medications on file prior to visit.     Cardiovascular studies:  Event monitor 06/06/2019- 07/05/2019:  Dominant rhythm sinus. Max HR 138 bpm, min HR 55 bpm.  40 patient trigerred events with complaints of lightheadedness/rapid heart beat/shortness of breath/chest pain occur with sinus rhythhm/tachcyardia, with/without PVC.  One episode of "passing out" on 06/23/2019 10:59 bpm with normal sinus rhythm. No Afib/atrial flutter/ventricular tachycardia/sinus pauses/high grade AV blocks seen.   In summary, no causal relation with any specific rhythm.   Echocardiogram 01/11/2019: Left ventricle cavity is normal in size. Mild concentric hypertrophy of the left ventricle. Normal global wall motion. Doppler evidence of grade I (impaired) diastolic dysfunction, normal LAP. Calculated EF 67%. Left atrial cavity is normal in size. Aneurysmal interatrial septum without 2D or color Doppler evidence of interatrial shunt.  Mild mitral valve leaflet thickening. Mild to moderate mitral regurgitation. Mild tricuspid regurgitation. Inadequate TR jet  to estimate PA systolic pressure. Normal right atrial pressure.   Coronary angiography 12/09/2017: Indication: Non-STEMI Severe native left main/LAD/left circumflex disease, functionally occluded Patent LIMA to LAD, diffuse disease in native LAD distal to anastomosis Patent SVG-diagonal-OM (?) Severe proximal RCA stenosis proximal to previous stent, and moderate in-stent restenosis (previous 3.0X23 mm BMS)---> successful PCI to RCA with 2 overlapping stents Xience 3.25 x 38 mm, Xience 3.25 X 18 mm DES  Recent labs: 12/04/2018: Glucose132, BUN/creatinine 16.  EGFR 55.  Sodium 140, potassium 50.  Rest the CMP normal. H/H 13/41.  MCV 86.  Platelets 316 Hemoglobin A1c 7.5% Cholesterol 190, triglycerides 98, HDL 72, LDL 98.   Review of Systems  Constitution: Negative for decreased appetite, malaise/fatigue, weight gain and weight loss.  HENT: Negative for congestion.   Eyes: Negative for visual disturbance.  Cardiovascular: Positive for syncope. Negative for chest pain, dyspnea on exertion, leg swelling and palpitations.  Respiratory: Negative for cough.   Endocrine: Negative for cold intolerance.  Hematologic/Lymphatic: Does not bruise/bleed easily.  Skin: Negative for itching and rash.  Musculoskeletal: Negative for myalgias.  Gastrointestinal: Negative for abdominal pain, nausea and vomiting.  Genitourinary: Negative for dysuria.  Neurological: Negative for dizziness and weakness.  Psychiatric/Behavioral: The patient is not nervous/anxious.   All other systems reviewed and are negative.        Vitals:   08/01/19 1332  BP: 114/76  Pulse: 81   (Measured by the patient using a home BP monitor)  Body mass index is 32.95 kg/m. Filed Weights   08/01/19 1332  Weight: 198 lb (89.8 kg)     Observation/findings during video visit   Objective:    Physical Exam  Constitutional: She is oriented to person, place, and time. She appears well-developed and well-nourished. No  distress.  Pulmonary/Chest: Effort normal.  Neurological: She is alert and oriented to person, place, and time.  Psychiatric:  She has a normal mood and affect.  Nursing note and vitals reviewed.         Assessment & Recommendations:   68 y/o [place] American female with coronary artery disease s/p CABG and PCI's, controlled hypertension and hyperlipidemia, after diabetes mellitus, fibromyalgia, migraine headaches, recurrent syncope.  Syncope: No arrhtymogenic etiology identified on event monitor, in spite of having documented episodes of "passing out". I will check carotid US. She recently saw Dr. Krista Blue for migraines. I will request her to follow up with Dr. Krista Blue again, in case her symptoms could in any way be related to any neurologic etiology. Neurocardiogenic syncope remains the most likely differential. In the meantime, recommend liberal hydration and regular use of compression stockings. Will check orthostatics at next visit.   CAD: S/p prior CABG and PCI's. Currently stable with no angina symptoms. On appropriate medical therapy. In absence of any bleeding issues, recommend continuing long-term dual antiplatelet therapy in the event of multiple prior revascularization. Continue isosorbide mononitrate 30 mg, metoprolol succinate 25 mg, Repatha  Mild MR: Clinically stable.  Hypertension: Controlled  Hyperlipidemia: Continue Repatha.  Type 2 DM: Management as per PCP. She was reportedly taken off Jardiance due to her renal function    Nigel Mormon, MD [facility] Pager: [phone] Office: [phone] If no answer Cell [phone]

## 2019-08-01 NOTE — Telephone Encounter (Addendum)
She is agreeable to the increased dose of Aimovig to 140mg .  States the Bertrand works well for her rescue medication and would like a refill on it, if possible.  She understands that she will only be allowed #10 per month. Dr. Krista Blue has authorized for it to be refilled.  The [place] prescription and Aimovig 70mg  refills have been canceled at [facility] [phone] - spoke to [place]).  She has been scheduled for a follow up with Butler Denmark NP on 09/11/2019.

## 2019-08-03 DIAGNOSIS — N183 Chronic kidney disease, stage 3 (moderate): Secondary | ICD-10-CM | POA: Diagnosis not present

## 2019-08-03 DIAGNOSIS — I10 Essential (primary) hypertension: Secondary | ICD-10-CM | POA: Diagnosis not present

## 2019-08-03 DIAGNOSIS — E119 Type 2 diabetes mellitus without complications: Secondary | ICD-10-CM | POA: Diagnosis not present

## 2019-08-07 DIAGNOSIS — N390 Urinary tract infection, site not specified: Secondary | ICD-10-CM | POA: Diagnosis not present

## 2019-08-07 DIAGNOSIS — I10 Essential (primary) hypertension: Secondary | ICD-10-CM | POA: Diagnosis not present

## 2019-08-07 DIAGNOSIS — E119 Type 2 diabetes mellitus without complications: Secondary | ICD-10-CM | POA: Diagnosis not present

## 2019-08-13 ENCOUNTER — Ambulatory Visit (INDEPENDENT_AMBULATORY_CARE_PROVIDER_SITE_OTHER): Payer: Medicare Other | Admitting: Neurology

## 2019-08-13 ENCOUNTER — Telehealth: Payer: Self-pay | Admitting: *Deleted

## 2019-08-13 ENCOUNTER — Ambulatory Visit: Payer: Self-pay | Admitting: Neurology

## 2019-08-13 ENCOUNTER — Other Ambulatory Visit: Payer: Self-pay

## 2019-08-13 ENCOUNTER — Encounter: Payer: Self-pay | Admitting: Neurology

## 2019-08-13 VITALS — BP 155/88 | HR 83 | Temp 97.9°F | Ht 65.0 in | Wt 198.0 lb

## 2019-08-13 DIAGNOSIS — I2510 Atherosclerotic heart disease of native coronary artery without angina pectoris: Secondary | ICD-10-CM | POA: Diagnosis not present

## 2019-08-13 DIAGNOSIS — IMO0002 Reserved for concepts with insufficient information to code with codable children: Secondary | ICD-10-CM

## 2019-08-13 DIAGNOSIS — G43709 Chronic migraine without aura, not intractable, without status migrainosus: Secondary | ICD-10-CM | POA: Diagnosis not present

## 2019-08-13 MED ORDER — DIVALPROEX SODIUM ER 250 MG PO TB24: 250.0000 mg | ORAL_TABLET | Freq: Every day | ORAL | 11 refills | Status: AC

## 2019-08-13 MED ORDER — TOPIRAMATE 100 MG PO TABS: 100.0000 mg | ORAL_TABLET | Freq: Every day | ORAL | 4 refills | Status: DC

## 2019-08-13 MED ORDER — TIZANIDINE HCL 2 MG PO CAPS: 2.0000 mg | ORAL_CAPSULE | Freq: Three times a day (TID) | ORAL | 6 refills | Status: DC

## 2019-08-13 MED ORDER — ONDANSETRON 4 MG PO TBDP: 4.0000 mg | ORAL_TABLET | Freq: Three times a day (TID) | ORAL | 6 refills | Status: DC | PRN

## 2019-08-13 NOTE — Telephone Encounter (Addendum)
Email from patient:  Dear Dr Krista Blue,   I am still suffering with the migraine. Yesterday was the first day it had eased up but it is back today in full swing! I heard about "ubrelvy". Is that something I can take? I saw my renal doctor last week & he does not want me on anything that is a NSAID. I've taken all of the 10 pills & I'm still suffering. Please help. Thank you. I'm willing to try Ubrelvy.    Holly Nichols  [phone]   I spoke to the patient and she has agreed to come in for an early follow up to discuss her migraine management.  She has been added to the schedule today.

## 2019-08-13 NOTE — Progress Notes (Signed)
   History: 68 year old female presented with 3 weeks history of daily moderate to severe headaches.  Bilateral occipital and trigeminal nerve block; trigger point injection of bilateral cervical and upper trapezius muscles for intractable headache  Bupivacaine 0.5% was injected on the scalp bilaterally at several locations:  -On the occipital area of the head, 3 injections each side, 0.5 cc per injection at the midpoint between the mastoid process and the occipital protuberance. 2 other injections were done one finger breadth from the initial injection, one at a 10 o'clock position and the other at a 2 o'clock position.  -2 injections of 0.5 cc were done in the temporal regions, 2 fingerbreadths above the tragus of the ear, with the second injection one fingerbreadth posteriorly to the first.  -2 injections were done on the brow, 1 in the medial brow and one over the supraorbital nerve notch, with 0.1 cc for each injection  -1 injection each side of 0.5 cc was done anterior to the tragus of the ear for a trigeminal ganglion injection  -0.5 cc was injected into bilateral upper trapezius and bilateral upper cervical paraspinals   The patient tolerated the injections well, no complications of the procedure were noted. Injections were made with a 27-gauge needle.

## 2019-08-13 NOTE — Progress Notes (Signed)
PATIENT: Holly Nichols DOB: 1951/07/10  Chief Complaint  Patient presents with  . Migraine    Reports still having frequent migraines and nausea (daily pain for the last three weeks).  She is using Aimovig 121m monthly and topiramate 1060mat bedtime.  Her supply of Fioricet (#10 tablets) has been taken.  States the physician who manages her kidney problems does not want her to take NSAIDS.      HISTORICAL  Holly Nichols a [age]ears old female, seen in request by her primary care physician Dr. OsBenito Mccreedyor evaluation of headache, initial evaluations was through virtual visit on June 19 2019.  I have reviewed and summarized the referring note from the referring physician. She has PMHx of DM since 2996, CAD, CABG, chronic migraine.   [place], previously she was under the care of neurologist for migraine headaches and low back pain. She was given Topamax and Depakote, which has a migraine headache under reasonable control, she was having migraines about 2-3 times each month,  [place], she now have increased migraine headaches, 2-3 times each week, she complains of right lateralized severe pounding headache with associated light noise sensitivity, nauseous, lasting for hours to days, she has tried Tylenol Aleve Advil, which has limited help for her headaches  She also complains of worsening low back pain, is using lidocaine patch, heating pad  UPDATE August 24th 2020: She was started on Aimovig 140 mg injection since last visit, also taking Topamax 100 mg every night, she reported only mild improvement, came in today with sunglasses on, reported 3 weeks history of 9 out of 10 daily headaches, failed home remedy, multiple dose of Fioricet  Laboratory evaluation in January 2020 showed creatinine of 0.95,  REVIEW OF SYSTEMS: Full 14 system review of systems performed and  notable only for as above All other review of systems were negative.  ALLERGIES: Allergies  Allergen Reactions  . Acetaminophen Other (See Comments)    Heart palpitations  . Aspirin Other (See Comments)    Stomach ulcers  . Codeine Other (See Comments)  . Depakote [Divalproex Sodium] Other (See Comments)    Kidney issues  . Hydroxychloroquine Other (See Comments)  . Hydroxychloroquine Sulfate   . Lithium Other (See Comments)  . Morphine Nausea Only, Nausea And Vomiting and Other (See Comments)  . Nortriptyline Other (See Comments)  . Nsaids Other (See Comments)    Heart palpitations  . Penicillin G Hives  . Prednisone Other (See Comments)    Heart palpation   . Statins Other (See Comments)  . Tape Other (See Comments)  . Topiramate Other (See Comments)    Kidney issues  . Tramadol Other (See Comments)    Heart palpatations  . Lisinopril Rash  . Thimerosal Rash    HOME MEDICATIONS: Current Outpatient Medications  Medication Sig Dispense Refill  . aspirin 81 MG EC tablet Enteric Coated Aspirin 81 mg tablet,delayed release  Take 1 tablet every day by oral route.    . Biotin 10000 MCG TABS biotin  Take one tablet by mouth daily.    . butalbital-acetaminophen-caffeine (FIORICET) 50-325-40 MG tablet Take 1 tablet by mouth every 6 (six) hours as needed for headache. Do not take more than 10 tablets per month. 10 tablet 5  . Cholecalciferol (VITAMIN D3) 125 MCG (5000 UT) TBDP Take by mouth.    . clobetasol (TEMOVATE)  0.05 % external solution clobetasol 0.05 % scalp solution    . clopidogrel (PLAVIX) 75 MG tablet clopidogrel 75 mg tablet  TAKE 1 TABLET EVERY DAY    . Cyanocobalamin 5000 MCG CAPS Take by mouth.    . cyclobenzaprine (FLEXERIL) 10 MG tablet Take 10 mg by mouth daily.     Eduard Roux (AIMOVIG) 140 MG/ML SOAJ Inject 140 mg into the skin every 30 (thirty) days. 1 pen 11  . finasteride (PROSCAR) 5 MG tablet 1/2 tab daily    . fluticasone (FLONASE) 50 MCG/ACT  nasal spray Place into the nose as needed.    . furosemide (LASIX) 40 MG tablet Take by mouth daily.    Marland Kitchen glucagon (GLUCAGON EMERGENCY) 1 MG injection Glucagon Emergency Kit (human-recomb) 1 mg solution for injection  Take by injection route as needed for 30 days.    . Insulin Glargine (LANTUS SOLOSTAR) 100 UNIT/ML Solostar Pen 12 Units 2 (two) times a day.     . isosorbide dinitrate (ISORDIL) 30 MG tablet Take by mouth at bedtime.    Marland Kitchen ketoconazole (NIZORAL) 2 % shampoo 3x weekly    . lidocaine-prilocaine (EMLA) cream Apply topically daily.     . meloxicam (MOBIC) 15 MG tablet Take 1 tablet by mouth daily.    . metoprolol succinate (TOPROL-XL) 50 MG 24 hr tablet Take 50 mg by mouth daily.     . metroNIDAZOLE (METROCREAM) 0.75 % cream Apply 1 application topically daily.     . Multiple Vitamins-Minerals (MULTIVITAMIN ADULTS 50+ PO) daily.    . nitroGLYCERIN (NITROSTAT) 0.4 MG SL tablet as needed.    . pantoprazole (PROTONIX) 40 MG tablet Take 40 mg by mouth daily.    Marland Kitchen REPATHA SURECLICK 308 MG/ML SOAJ [id number]    . Semaglutide (OZEMPIC, 0.25 OR 0.5 MG/DOSE, [place]) Inject into the skin once a week.    . topiramate (TOPAMAX) 100 MG tablet Take 1 tablet (100 mg total) by mouth at bedtime. 90 tablet 4  . divalproex (DEPAKOTE ER) 250 MG 24 hr tablet Take 1 tablet (250 mg total) by mouth daily. 30 tablet 11  . ondansetron (ZOFRAN ODT) 4 MG disintegrating tablet Take 1 tablet (4 mg total) by mouth every 8 (eight) hours as needed. 20 tablet 6  . tizanidine (ZANAFLEX) 2 MG capsule Take 1 capsule (2 mg total) by mouth 3 (three) times daily. 30 capsule 6   No current facility-administered medications for this visit.     PAST MEDICAL HISTORY: Past Medical History:  Diagnosis Date  . CKD (chronic kidney disease)   . Coronary artery disease   . Diabetes mellitus without complication ([place])   . Fibromyalgia   . H/O shoulder surgery 2017   right  . 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  History of lupus ([place])    "tested as borderline"  . Hyperlipidemia   . Hypertension   . Lumbar pain   . Migraine   . OSA (obstructive sleep apnea)    failed CPAP therapy in the past  . Rosacea     PAST SURGICAL HISTORY: Past Surgical History:  Procedure Laterality Date  . ANKLE SURGERY Right 2002  . CARDIAC CATHETERIZATION  01/2017   3 Stents  . CORONARY ANGIOPLASTY  01/2007  . CORONARY ARTERY BYPASS GRAFT  2002  . HERNIA REPAIR  2005  . SHOULDER SURGERY Right 2017  . TOTAL VAGINAL HYSTERECTOMY  12/1990  . WRIST SURGERY  2006    FAMILY HISTORY: Family  History  Problem Relation Age of Onset  . Stroke Mother   . Lung cancer Mother   . Hypertension Mother   . Heart attack Father   . Diabetes Father   . Diabetes Sister   . Diabetes Brother   . Heart failure Brother   . Osteoporosis Sister   . Hypertension Sister   . Hyperlipidemia Sister   . Arthritis Sister     SOCIAL HISTORY: Social History   Socioeconomic History  . Marital status: Divorced    Spouse name: Not on file  . Number of children: 2  . Years of education: college  . Highest education level: Bachelor's degree (e.g., BA, AB, BS)  Occupational History  . Occupation: Retired  Scientific laboratory technician  . Financial resource strain: Not on file  . Food insecurity    Worry: Not on file    Inability: Not on file  . Transportation needs    Medical: Not on file    Non-medical: Not on file  Tobacco Use  . Smoking status: Never Smoker  . Smokeless tobacco: Never Used  Substance and Sexual Activity  . Alcohol use: Yes    Comment: rarely  . Drug use: Never  . Sexual activity: Not on file  Lifestyle  . Physical activity    Days per week: Not on file    Minutes per session: Not on file  . Stress: Not on file  Relationships  . Social Herbalist on phone: Not on file    Gets together: Not on file    Attends religious service: Not on file    Active member of club or organization: Not on file     Attends meetings of clubs or organizations: Not on file    Relationship status: Not on file  . Intimate partner violence    Fear of current or ex partner: Not on file    Emotionally abused: Not on file    Physically abused: Not on file    Forced sexual activity: Not on file  Other Topics Concern  . Not on file  Social History Narrative   Lives with cousin      Steps inside of home      Right and left handed      Highest level of education- bachelors degree      Retired from [employer]      Occasional use of caffeine.        PHYSICAL EXAM   Vitals:   08/13/19 1517  BP: (!) 155/88  Pulse: 83  Temp: 97.9 F (36.6 C)  Weight: 198 lb (89.8 kg)  Height: '5\' 5"'  (1.651 m)    Not recorded      Body mass index is 32.95 kg/m.  PHYSICAL EXAMNIATION:  Gen: NAD, conversant, well nourised, obese, well groomed                     Cardiovascular: Regular rate rhythm, no peripheral edema, warm, nontender. Eyes: Conjunctivae clear without exudates or hemorrhage Neck: Supple, no carotid bruits. Pulmonary: Clear to auscultation bilaterally   NEUROLOGICAL EXAM:  MENTAL STATUS: Speech:    Speech is normal; fluent and spontaneous with normal comprehension.  Cognition:     Orientation to time, place and person     Normal recent and remote memory     Normal Attention span and concentration     Normal Language, naming, repeating,spontaneous speech     Fund of knowledge   CRANIAL  NERVES: CN II: Visual fields are full to confrontation.Pupils are round equal and briskly reactive to light. CN III, IV, VI: extraocular movement are normal. No ptosis. CN V: Facial sensation is intact to pinprick in all 3 divisions bilaterally. Corneal responses are intact.  CN VII: Face is symmetric with normal eye closure and smile. CN VIII: Hearing is normal to rubbing fingers CN IX, X: Palate elevates symmetrically. Phonation is normal. CN XI: Head turning and shoulder shrug are intact CN  XII: Tongue is midline with normal movements and no atrophy.  MOTOR: There is no pronator drift of out-stretched arms. Muscle bulk and tone are normal. Muscle strength is normal.  REFLEXES: Reflexes are 2+ and symmetric at the biceps, triceps, knees, and ankles. Plantar responses are flexor.  SENSORY: Intact to light touch, pinprick, positional sensation and vibratory sensation are intact in fingers and toes.  COORDINATION: Rapid alternating movements and fine finger movements are intact. There is no dysmetria on finger-to-nose and heel-knee-shin.    GAIT/STANCE: She push on chair arm to get up from seated position, cautious   DIAGNOSTIC DATA (LABS, IMAGING, TESTING) - I reviewed patient records, labs, notes, testing and imaging myself where available.   ASSESSMENT AND PLAN  Holly Nichols is a 68 y.o. female    Chronic migraine headaches  Continue aimovig 140 mg once every months  Topamax 100 mg daily  Add on Depakote ER 250 mg every night as preventive medications  Fioricet may mix together with tizanidine, Zofran for abortive treatment, not a candidate for triptan due to history of coronary artery disease, open heart surgery  If she continue have significant headaches, may consider laboratory evaluations, ESR C-reactive protein, TSH, brain imaging at next follow-up visit  Also performed trigger point injection today  Marcial Pacas, M.D. Ph.D.  [facility] [address], [place], [place] [address] Ph: [phone] Fax: [phone]  CC: Referring Provider

## 2019-08-13 NOTE — Patient Instructions (Signed)
During migraine headaches, You may take  Fioricet as needed  Along with Tizanidine 2mg  as muscle relaxant zofran 4mg  for nause.

## 2019-08-22 IMAGING — US US RENAL
1 series · 14 of 25 positions shown · non-contrast
Comparison: None.

CLINICAL DATA: Chronic renal disease, stage III.

EXAM:
RENAL / URINARY TRACT ULTRASOUND COMPLETE

[Series 1: us renal · 0.22mm/px · 14 of 29 slices shown]
[im 1/29]
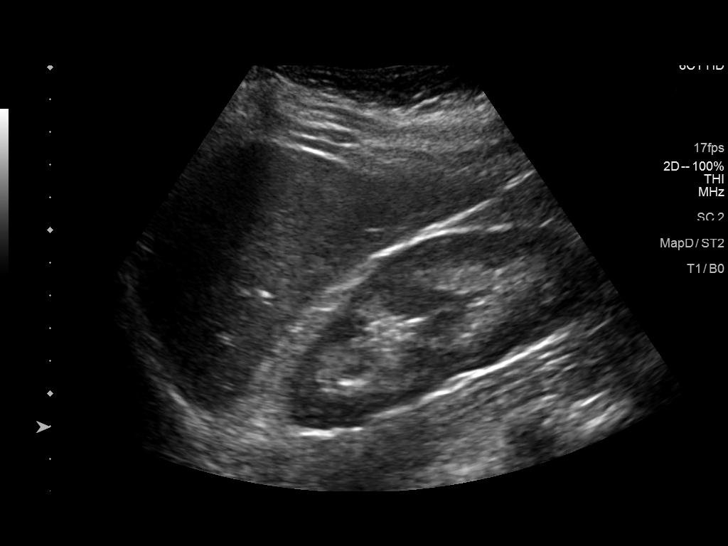
[im 3/29]
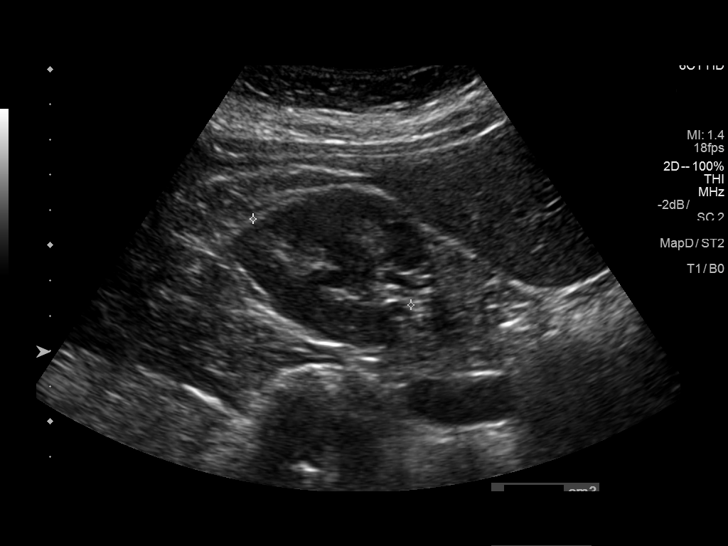
[im 5/29]
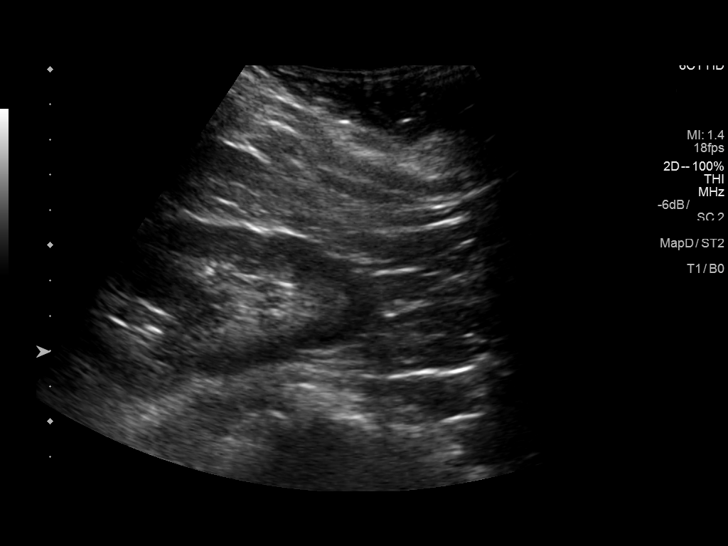
[im 8/29]
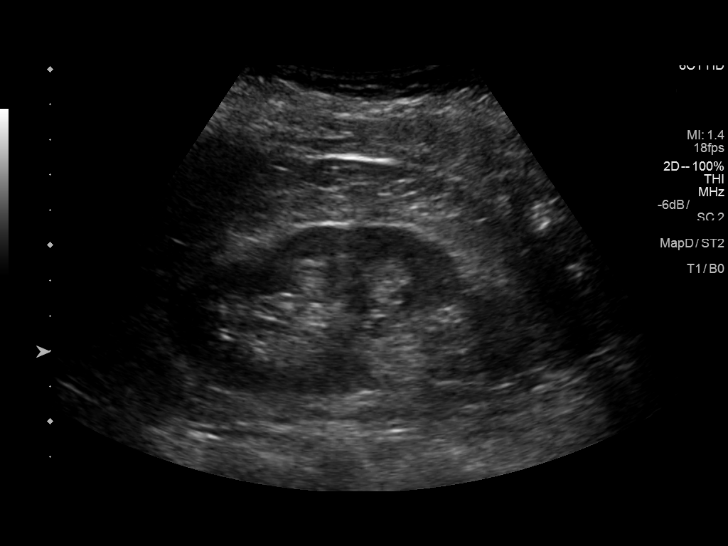
[im 10/29]
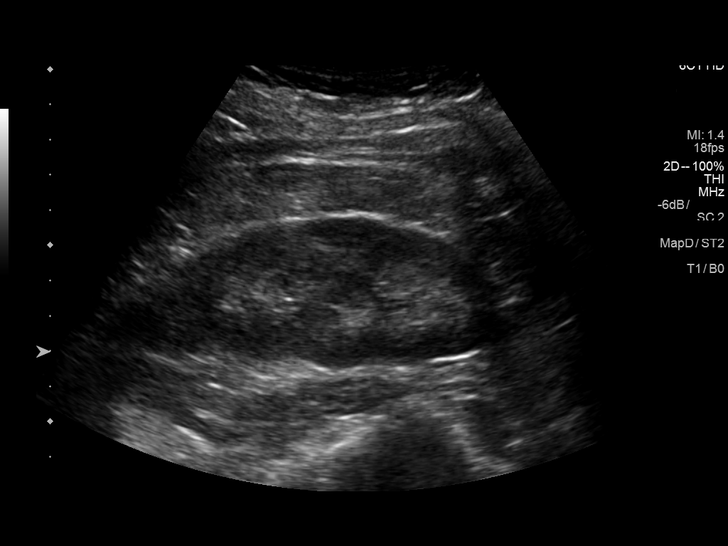
[im 11/29]
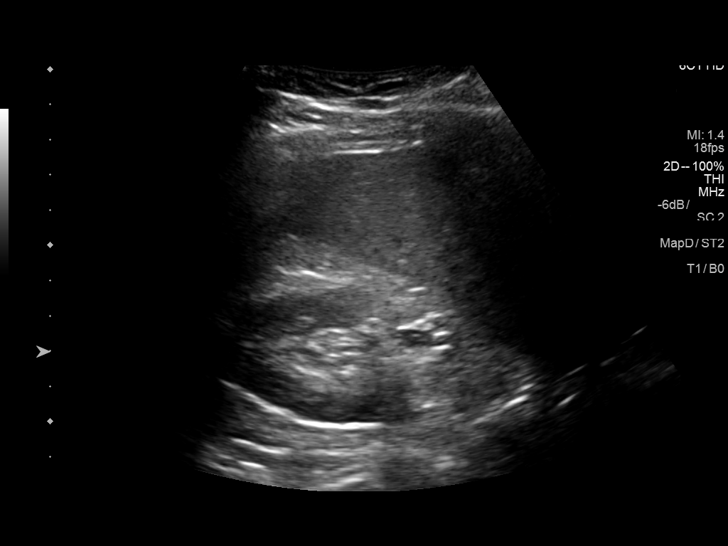
[im 13/29]
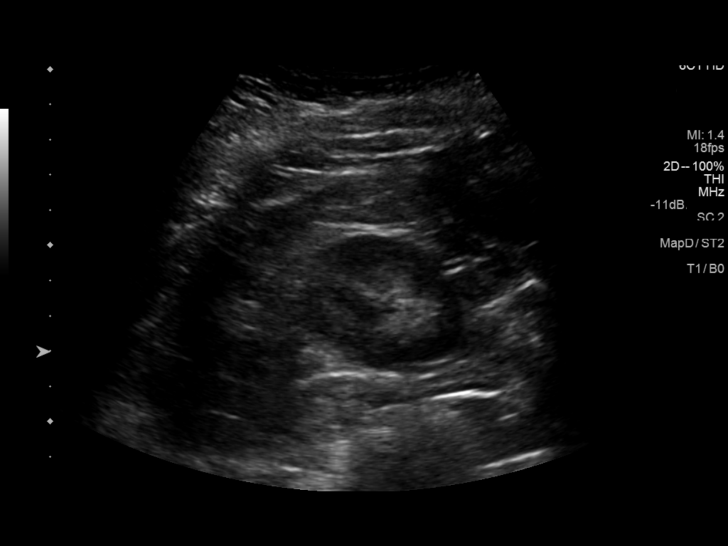
[im 16/29]
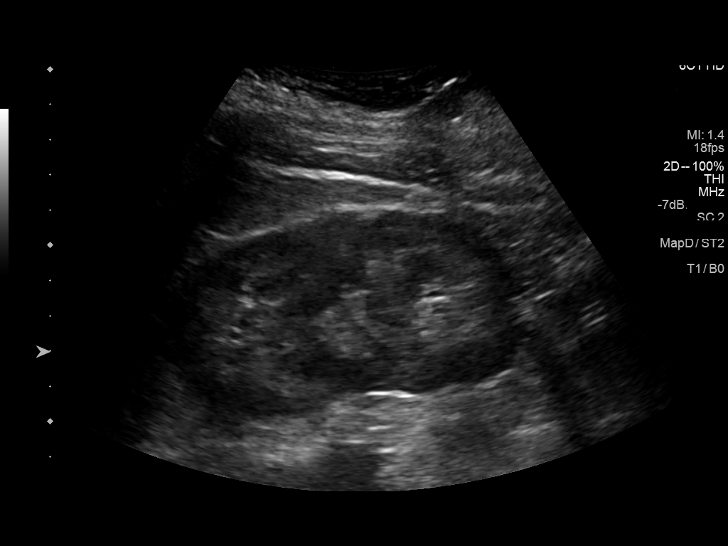
[im 18/29]
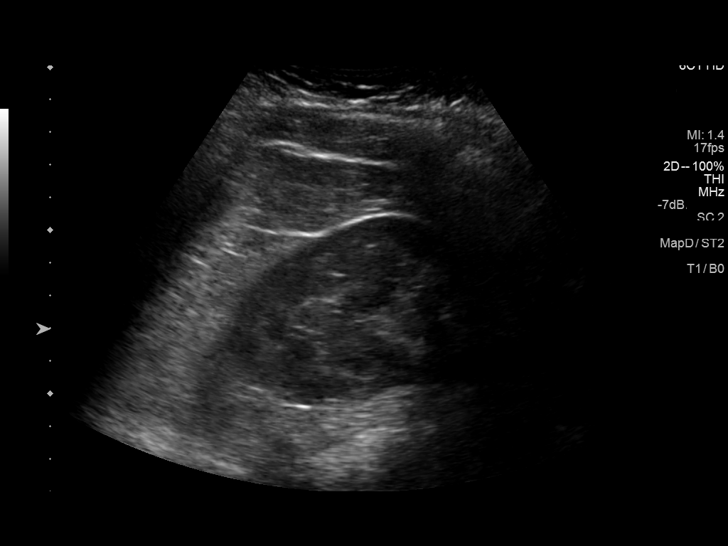
[im 19/29]
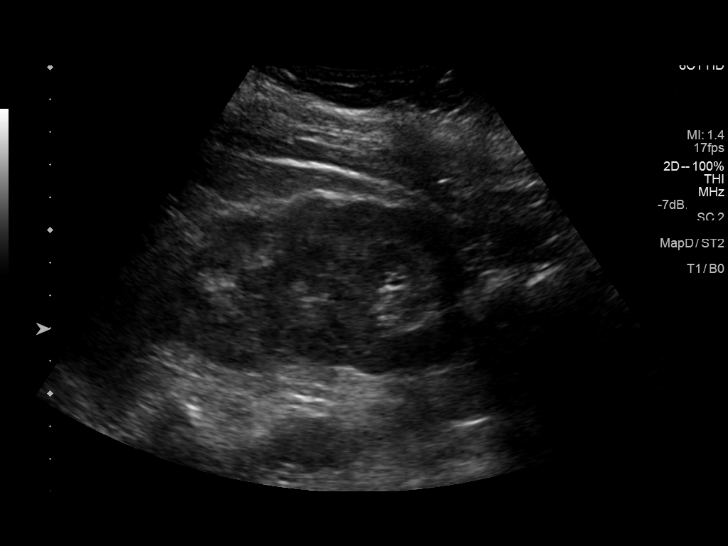
[im 22/29]
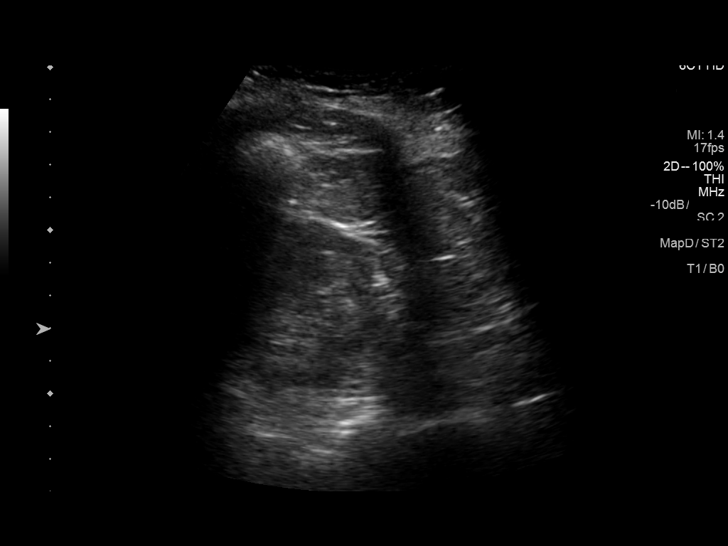
[im 24/29]
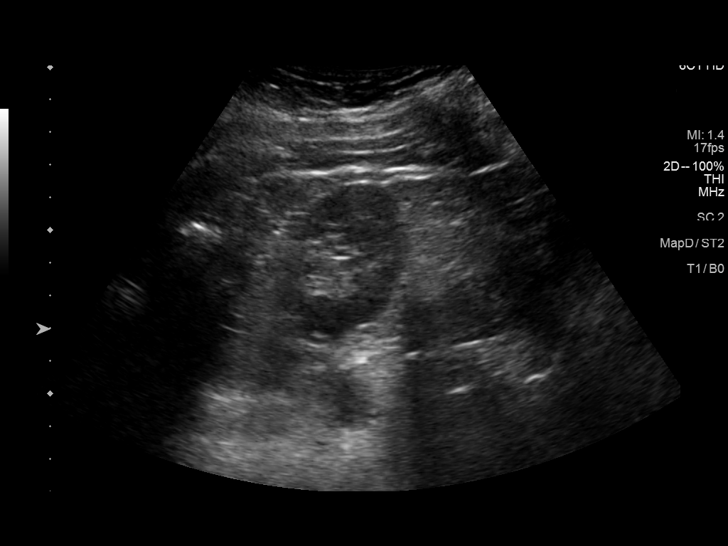
[im 26/29]
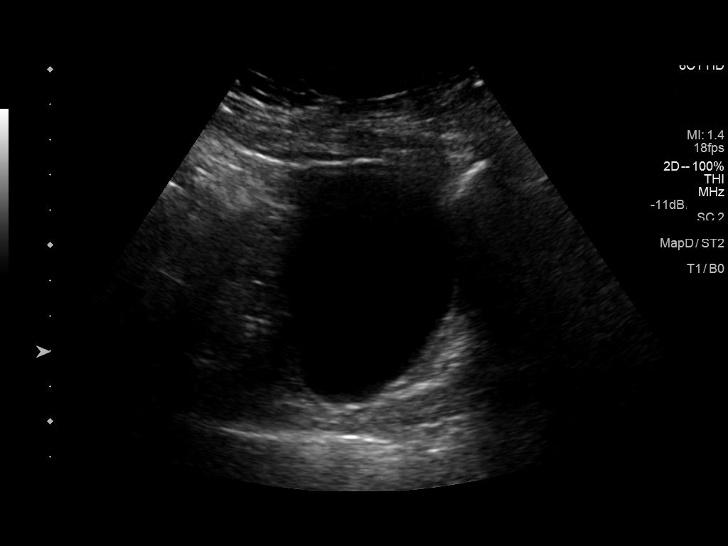
[im 29/29]
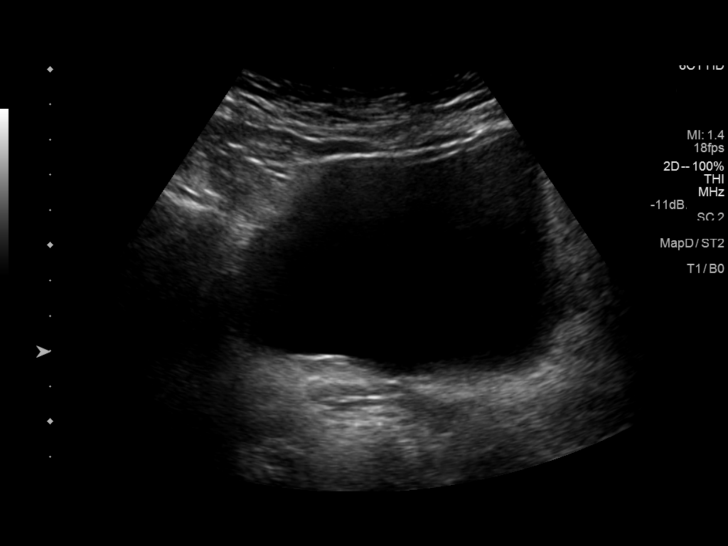

[14 of 25 positions shown; findings below may reference images not displayed]

FINDINGS: Right Kidney:

Renal measurements: 12.2 x 4.7 x 5.1 cm = volume: 135 mL .
Echogenicity within normal limits. No mass or hydronephrosis
visualized.

Left Kidney:

Renal measurements: 10.1 x 5.0 x 4.4 cm = volume: 116 mL.
Echogenicity within normal limits. No mass or hydronephrosis
visualized.

Bladder:

Appears normal for degree of bladder distention.
IMPRESSION: Normal renal ultrasound.

## 2019-08-30 ENCOUNTER — Other Ambulatory Visit: Payer: Self-pay

## 2019-08-30 ENCOUNTER — Ambulatory Visit (INDEPENDENT_AMBULATORY_CARE_PROVIDER_SITE_OTHER): Payer: Medicare Other

## 2019-08-30 DIAGNOSIS — R55 Syncope and collapse: Secondary | ICD-10-CM | POA: Diagnosis not present

## 2019-09-02 NOTE — Progress Notes (Signed)
Subjective:   Holly Nichols, female    DOB: [birth year], 68 y.o.   MRN: [id number]    Chief complaint:  Syncope   HPI  68 y/o [place] American female with coronary artery disease s/p CABG and PCI's, controlled hypertension and hyperlipidemia, after diabetes mellitus, fibromyalgia, migraine headaches, recurrent syncope.  No cardiac structural or arrhythmogenic etiology was found, details below. Carotid US did not show significant disease.  Patient continues to have "passing out" episodes on a daily basis.  She has not had any significant injury.  In the last few weeks, she has noticed episodes of heart rate racing about 100 bpm, which were associated with left-sided chest pain.  She has noticed that her recent refill of metoprolol succinate was 25 mg daily, instead of 50 mg daily.    Past Medical History:  Diagnosis Date  . CKD (chronic kidney disease)   . Coronary artery disease   . Diabetes mellitus without complication ([place])   . Fibromyalgia   . H/O shoulder surgery 2017   right  . 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 History of lupus ([place])    "tested as borderline"  . Hyperlipidemia   . Hypertension   . Lumbar pain   . Migraine   . OSA (obstructive sleep apnea)    failed CPAP therapy in the past  . Rosacea      Past Surgical History:  Procedure Laterality Date  . ANKLE SURGERY Right 2002  . CARDIAC CATHETERIZATION  01/2017   3 Stents  . CORONARY ANGIOPLASTY  01/2007  . CORONARY ARTERY BYPASS GRAFT  2002  . HERNIA REPAIR  2005  . SHOULDER SURGERY Right 2017  . TOTAL VAGINAL HYSTERECTOMY  12/1990  . WRIST SURGERY  2006     Social History   Socioeconomic History  . Marital status: Divorced    Spouse name: Not on file  . Number of children: 2  . Years of education: college  . Highest education level: Bachelor's degree (e.g., BA, AB, BS)  Occupational History  . Occupation: Retired  Scientific laboratory technician  . Financial resource strain: Not on  file  . Food insecurity    Worry: Not on file    Inability: Not on file  . Transportation needs    Medical: Not on file    Non-medical: Not on file  Tobacco Use  . Smoking status: Never Smoker  . Smokeless tobacco: Never Used  Substance and Sexual Activity  . Alcohol use: Yes    Comment: rarely  . Drug use: Never  . Sexual activity: Not on file  Lifestyle  . Physical activity    Days per week: Not on file    Minutes per session: Not on file  . Stress: Not on file  Relationships  . Social Herbalist on phone: Not on file    Gets together: Not on file    Attends religious service: Not on file    Active member of club or organization: Not on file    Attends meetings of clubs or organizations: Not on file    Relationship status: Not on file  . Intimate partner violence    Fear of current or ex partner: Not on file    Emotionally abused: Not on file    Physically abused: Not on file    Forced sexual activity: Not on file  Other Topics Concern  . Not on file  Social History Narrative   Lives  with cousin      Steps inside of home      Right and left handed      Highest level of education- bachelors degree      Retired from [employer]      Occasional use of caffeine.        Family History  Problem Relation Age of Onset  . Stroke Mother   . Lung cancer Mother   . Hypertension Mother   . Heart attack Father   . Diabetes Father   . Diabetes Sister   . Diabetes Brother   . Heart failure Brother   . Osteoporosis Sister   . Hypertension Sister   . Hyperlipidemia Sister   . Arthritis Sister      Current Outpatient Medications on File Prior to Visit  Medication Sig Dispense Refill  . aspirin 81 MG EC tablet Enteric Coated Aspirin 81 mg tablet,delayed release  Take 1 tablet every day by oral route.    . Biotin 10000 MCG TABS biotin  Take one tablet by mouth daily.    . butalbital-acetaminophen-caffeine (FIORICET) 50-325-40 MG tablet Take 1  tablet by mouth every 6 (six) hours as needed for headache. Do not take more than 10 tablets per month. 10 tablet 5  . Cholecalciferol (VITAMIN D3) 125 MCG (5000 UT) TBDP Take by mouth.    . clobetasol (TEMOVATE) 0.05 % external solution clobetasol 0.05 % scalp solution    . clopidogrel (PLAVIX) 75 MG tablet clopidogrel 75 mg tablet  TAKE 1 TABLET EVERY DAY    . Cyanocobalamin 5000 MCG CAPS Take by mouth.    . cyclobenzaprine (FLEXERIL) 10 MG tablet Take 10 mg by mouth daily.     . divalproex (DEPAKOTE ER) 250 MG 24 hr tablet Take 1 tablet (250 mg total) by mouth daily. 30 tablet 11  . Erenumab-aooe (AIMOVIG) 140 MG/ML SOAJ Inject 140 mg into the skin every 30 (thirty) days. 1 pen 11  . finasteride (PROSCAR) 5 MG tablet 1/2 tab daily    . fluticasone (FLONASE) 50 MCG/ACT nasal spray Place into the nose as needed.    . furosemide (LASIX) 40 MG tablet Take by mouth daily.    Marland Kitchen glucagon (GLUCAGON EMERGENCY) 1 MG injection Glucagon Emergency Kit (human-recomb) 1 mg solution for injection  Take by injection route as needed for 30 days.    . Insulin Glargine (LANTUS SOLOSTAR) 100 UNIT/ML Solostar Pen 12 Units 2 (two) times a day.     . isosorbide dinitrate (ISORDIL) 30 MG tablet Take by mouth at bedtime.    Marland Kitchen ketoconazole (NIZORAL) 2 % shampoo 3x weekly    . lidocaine-prilocaine (EMLA) cream Apply topically daily.     . meloxicam (MOBIC) 15 MG tablet Take 1 tablet by mouth daily.    . metoprolol succinate (TOPROL-XL) 50 MG 24 hr tablet Take 50 mg by mouth daily.     . metroNIDAZOLE (METROCREAM) 0.75 % cream Apply 1 application topically daily.     . Multiple Vitamins-Minerals (MULTIVITAMIN ADULTS 50+ PO) daily.    . nitroGLYCERIN (NITROSTAT) 0.4 MG SL tablet as needed.    . ondansetron (ZOFRAN ODT) 4 MG disintegrating tablet Take 1 tablet (4 mg total) by mouth every 8 (eight) hours as needed. 20 tablet 6  . pantoprazole (PROTONIX) 40 MG tablet Take 40 mg by mouth daily.    Marland Kitchen REPATHA SURECLICK 353  MG/ML SOAJ q2wks    . Semaglutide (OZEMPIC, 0.25 OR 0.5 MG/DOSE, [place]) Inject into the  skin once a week.    . tizanidine (ZANAFLEX) 2 MG capsule Take 1 capsule (2 mg total) by mouth 3 (three) times daily. 30 capsule 6  . topiramate (TOPAMAX) 100 MG tablet Take 1 tablet (100 mg total) by mouth at bedtime. 90 tablet 4   No current facility-administered medications on file prior to visit.     Cardiovascular studies:  EKG 09/03/2019: Sinus rhythm 76 bpm. Nonspecific T-abnormality.   Carotid artery duplex  08/30/2019: No significant stenosis in the right ICA. Minimal stenosis in the left ICA (1-15%). Very mild atherosclerotic plaque noted bilaterally. Antegrade right vertebral artery flow. Antegrade left vertebral artery flow.  Event monitor 06/06/2019- 07/05/2019:  Dominant rhythm sinus. Max HR 138 bpm, min HR 55 bpm.  40 patient trigerred events with complaints of lightheadedness/rapid heart beat/shortness of breath/chest pain occur with sinus rhythhm/tachcyardia, with/without PVC.  One episode of "passing out" on 06/23/2019 10:59 bpm with normal sinus rhythm. No Afib/atrial flutter/ventricular tachycardia/sinus pauses/high grade AV blocks seen.   In summary, no causal relation with any specific rhythm.   Echocardiogram 01/11/2019: Left ventricle cavity is normal in size. Mild concentric hypertrophy of the left ventricle. Normal global wall motion. Doppler evidence of grade I (impaired) diastolic dysfunction, normal LAP. Calculated EF 67%. Left atrial cavity is normal in size. Aneurysmal interatrial septum without 2D or color Doppler evidence of interatrial shunt.  Mild mitral valve leaflet thickening. Mild to moderate mitral regurgitation. Mild tricuspid regurgitation. Inadequate TR jet to estimate PA systolic pressure. Normal right atrial pressure.   Coronary angiography 12/09/2017: Indication: Non-STEMI Severe native left main/LAD/left circumflex disease, functionally occluded  Patent LIMA to LAD, diffuse disease in native LAD distal to anastomosis Patent SVG-diagonal-OM (?) Severe proximal RCA stenosis proximal to previous stent, and moderate in-stent restenosis (previous 3.0X23 mm BMS)---> successful PCI to RCA with 2 overlapping stents Xience 3.25 x 38 mm, Xience 3.25 X 18 mm DES  Recent labs: 12/04/2018: Glucose132, BUN/creatinine 16.  EGFR 55.  Sodium 140, potassium 50.  Rest the CMP normal. H/H 13/41.  MCV 86.  Platelets 316 Hemoglobin A1c 7.5% Cholesterol 190, triglycerides 98, HDL 72, LDL 98.   Review of Systems  Constitution: Negative for decreased appetite, malaise/fatigue, weight gain and weight loss.  HENT: Negative for congestion.   Eyes: Negative for visual disturbance.  Cardiovascular: Positive for syncope. Negative for chest pain, dyspnea on exertion, leg swelling and palpitations.  Respiratory: Negative for cough.   Endocrine: Negative for cold intolerance.  Hematologic/Lymphatic: Does not bruise/bleed easily.  Skin: Negative for itching and rash.  Musculoskeletal: Negative for myalgias.  Gastrointestinal: Negative for abdominal pain, nausea and vomiting.  Genitourinary: Negative for dysuria.  Neurological: Negative for dizziness and weakness.  Psychiatric/Behavioral: The patient is not nervous/anxious.   All other systems reviewed and are negative.       Vitals:   09/03/19 1148  BP: 114/72  Pulse: 81  Temp: 98 F (36.7 C)  SpO2: 97%     Body mass index is 31.95 kg/m. Filed Weights   09/03/19 1148  Weight: 192 lb (87.1 kg)     Objective:    Physical Exam  Constitutional: She is oriented to person, place, and time. She appears well-developed and well-nourished. No distress.  HENT:  Head: Normocephalic and atraumatic.  Eyes: Pupils are equal, round, and reactive to light. Conjunctivae are normal.  Neck: No JVD present.  Cardiovascular: Normal rate, regular rhythm and intact distal pulses.  Pulmonary/Chest: Effort  normal and breath sounds normal. She has  no wheezes. She has no rales.  Abdominal: Soft. Bowel sounds are normal. There is no rebound.  Musculoskeletal:        General: No edema.  Lymphadenopathy:    She has no cervical adenopathy.  Neurological: She is alert and oriented to person, place, and time. No cranial nerve deficit.  Skin: Skin is warm and dry.  Psychiatric: She has a normal mood and affect.  Nursing note and vitals reviewed.         Assessment & Recommendations:   68 y/o [place] female with coronary artery disease s/p CABG and PCI's, controlled hypertension and hyperlipidemia, after diabetes mellitus, fibromyalgia, migraine headaches, recurrent syncope.  Syncope: No arrhtymogenic etiology identified on event monitor, in spite of having documented episodes of "passing out". Reassuring echocardiogram in 12/2018. No signifciant disease on carotid US 08/2019. Neurocardiogenic syncope remains the most likely differential. In the meantime, recommend liberal hydration and regular use of compression stockings.   CAD: S/p prior CABG and PCI's.  Episodes of chest pain related to heart rate greater than 100 bpm.  Increase metoprolol succinate to original dose of 50 mg daily.  If she continues to have episodes of tachycardia associated with chest pain, will then recommend 4-week event monitor and Lexiscan nuclear stress test.    In absence of any bleeding issues, recommend continuing long-term dual antiplatelet therapy in the event of multiple prior revascularization. Continue isosorbide mononitrate 30 mg,  Repatha  Mild MR: Clinically stable.  Hypertension: Controlled  Hyperlipidemia: Continue Repatha.  Type 2 DM: Management as per PCP. She was reportedly taken off Jardiance due to her renal function  Follow-up telephone visit in 3 weeks.  Nigel Mormon, MD [facility] Pager: [phone] Office: [phone] If no answer Cell  [phone]

## 2019-09-03 ENCOUNTER — Ambulatory Visit (INDEPENDENT_AMBULATORY_CARE_PROVIDER_SITE_OTHER): Payer: Medicare Other | Admitting: Cardiology

## 2019-09-03 ENCOUNTER — Other Ambulatory Visit: Payer: Self-pay

## 2019-09-03 ENCOUNTER — Encounter: Payer: Self-pay | Admitting: Cardiology

## 2019-09-03 VITALS — BP 114/72 | HR 81 | Temp 98.0°F | Ht 65.0 in | Wt 192.0 lb

## 2019-09-03 DIAGNOSIS — I25118 Atherosclerotic heart disease of native coronary artery with other forms of angina pectoris: Secondary | ICD-10-CM

## 2019-09-03 DIAGNOSIS — R000 Tachycardia, unspecified: Secondary | ICD-10-CM | POA: Diagnosis not present

## 2019-09-03 DIAGNOSIS — R55 Syncope and collapse: Secondary | ICD-10-CM | POA: Diagnosis not present

## 2019-09-03 DIAGNOSIS — E782 Mixed hyperlipidemia: Secondary | ICD-10-CM | POA: Diagnosis not present

## 2019-09-03 MED ORDER — METOPROLOL SUCCINATE ER 50 MG PO TB24: 50.0000 mg | ORAL_TABLET | Freq: Every day | ORAL | 3 refills | Status: AC

## 2019-09-11 ENCOUNTER — Ambulatory Visit: Payer: Self-pay | Admitting: Neurology

## 2019-09-14 DIAGNOSIS — E119 Type 2 diabetes mellitus without complications: Secondary | ICD-10-CM | POA: Diagnosis not present

## 2019-09-14 DIAGNOSIS — N183 Chronic kidney disease, stage 3 (moderate): Secondary | ICD-10-CM | POA: Diagnosis not present

## 2019-09-14 DIAGNOSIS — I10 Essential (primary) hypertension: Secondary | ICD-10-CM | POA: Diagnosis not present

## 2019-09-18 DIAGNOSIS — N390 Urinary tract infection, site not specified: Secondary | ICD-10-CM | POA: Diagnosis not present

## 2019-09-18 DIAGNOSIS — E119 Type 2 diabetes mellitus without complications: Secondary | ICD-10-CM | POA: Diagnosis not present

## 2019-09-18 DIAGNOSIS — I10 Essential (primary) hypertension: Secondary | ICD-10-CM | POA: Diagnosis not present

## 2019-09-26 ENCOUNTER — Encounter: Payer: Self-pay | Admitting: Cardiology

## 2019-09-26 ENCOUNTER — Telehealth (INDEPENDENT_AMBULATORY_CARE_PROVIDER_SITE_OTHER): Payer: Medicare Other | Admitting: Cardiology

## 2019-09-26 ENCOUNTER — Other Ambulatory Visit: Payer: Self-pay

## 2019-09-26 VITALS — BP 120/80 | HR 78 | Ht 65.0 in | Wt 187.0 lb

## 2019-09-26 DIAGNOSIS — I2510 Atherosclerotic heart disease of native coronary artery without angina pectoris: Secondary | ICD-10-CM

## 2019-09-26 DIAGNOSIS — E782 Mixed hyperlipidemia: Secondary | ICD-10-CM

## 2019-09-26 DIAGNOSIS — I25118 Atherosclerotic heart disease of native coronary artery with other forms of angina pectoris: Secondary | ICD-10-CM

## 2019-09-26 DIAGNOSIS — R55 Syncope and collapse: Secondary | ICD-10-CM | POA: Diagnosis not present

## 2019-09-26 NOTE — Progress Notes (Signed)
Subjective:   Holly Nichols, female    DOB: [birth year], 68 y.o.   MRN: [id number]   I connected with the patient on 09/26/2019 by a telephone call and verified that I am speaking with the correct person using two identifiers.     I offered the patient a video enabled application for a virtual visit. Unfortunately, this could not be accomplished due to technical difficulties/lack of video enabled phone/computer. I discussed the limitations of evaluation and management by telemedicine and the availability of in person appointments. The patient expressed understanding and agreed to proceed.   This visit type was conducted due to national recommendations for restrictions regarding the COVID-19 Pandemic (e.g. social distancing).  This format is felt to be most appropriate for this patient at this time.  All issues noted in this document were discussed and addressed.  No physical exam was performed (except for noted visual exam findings with Tele health visits).  The patient has consented to conduct a Tele health visit and understands insurance will be billed.   Chief complaint:  Chest pain   HPI  68 y/o [place] female with coronary artery disease s/p CABG and PCI's, controlled hypertension and hyperlipidemia, after diabetes mellitus, fibromyalgia, migraine headaches, recurrent syncope.  No cardiac structural or arrhythmogenic etiology was found, details below. Carotid US did not show significant disease.  Patient continues to have "passing out" episodes on a daily basis.  She has not had any significant injury.  In the last few weeks, she had noticed episodes of heart rate racing about 100 bpm, which were associated with left-sided chest pain.  She has noticed that her recent refill of metoprolol succinate was 25 mg daily, instead of 50 mg daily.  Both her palpitations and chest pain episodes have improved after increasing metoprolol succinate to 50 mg daily.   Past Medical History:   Diagnosis Date  . CKD (chronic kidney disease)   . Coronary artery disease   . Diabetes mellitus without complication ([place])   . Fibromyalgia   . H/O shoulder surgery 2017   right  . 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 History of lupus ([place])    "tested as borderline"  . Hyperlipidemia   . Hypertension   . Lumbar pain   . Migraine   . OSA (obstructive sleep apnea)    failed CPAP therapy in the past  . Rosacea      Past Surgical History:  Procedure Laterality Date  . ANKLE SURGERY Right 2002  . CARDIAC CATHETERIZATION  01/2017   3 Stents  . CORONARY ANGIOPLASTY  01/2007  . CORONARY ARTERY BYPASS GRAFT  2002  . HERNIA REPAIR  2005  . SHOULDER SURGERY Right 2017  . TOTAL VAGINAL HYSTERECTOMY  12/1990  . WRIST SURGERY  2006     Social History   Socioeconomic History  . Marital status: Divorced    Spouse name: Not on file  . Number of children: 2  . Years of education: college  . Highest education level: Bachelor's degree (e.g., BA, AB, BS)  Occupational History  . Occupation: Retired  Scientific laboratory technician  . Financial resource strain: Not on file  . Food insecurity    Worry: Not on file    Inability: Not on file  . Transportation needs    Medical: Not on file    Non-medical: Not on file  Tobacco Use  . Smoking status: Never Smoker  . Smokeless tobacco: Never Used  Substance  and Sexual Activity  . Alcohol use: Yes    Comment: rarely  . Drug use: Never  . Sexual activity: Not on file  Lifestyle  . Physical activity    Days per week: Not on file    Minutes per session: Not on file  . Stress: Not on file  Relationships  . Social Herbalist on phone: Not on file    Gets together: Not on file    Attends religious service: Not on file    Active member of club or organization: Not on file    Attends meetings of clubs or organizations: Not on file    Relationship status: Not on file  . Intimate partner violence    Fear of current or ex  partner: Not on file    Emotionally abused: Not on file    Physically abused: Not on file    Forced sexual activity: Not on file  Other Topics Concern  . Not on file  Social History Narrative   Lives with cousin      Steps inside of home      Right and left handed      Highest level of education- bachelors degree      Retired from [employer]      Occasional use of caffeine.        Family History  Problem Relation Age of Onset  . Stroke Mother   . Lung cancer Mother   . Hypertension Mother   . Heart attack Father   . Diabetes Father   . Diabetes Sister   . Diabetes Brother   . Heart failure Brother   . Osteoporosis Sister   . Hypertension Sister   . Hyperlipidemia Sister   . Arthritis Sister      Current Outpatient Medications on File Prior to Visit  Medication Sig Dispense Refill  . aspirin 81 MG EC tablet Enteric Coated Aspirin 81 mg tablet,delayed release  Take 1 tablet every day by oral route.    . Biotin 10000 MCG TABS biotin  Take one tablet by mouth daily.    . butalbital-acetaminophen-caffeine (FIORICET) 50-325-40 MG tablet Take 1 tablet by mouth every 6 (six) hours as needed for headache. Do not take more than 10 tablets per month. 10 tablet 5  . Cholecalciferol (VITAMIN D3) 125 MCG (5000 UT) TBDP Take by mouth.    . clobetasol (TEMOVATE) 0.05 % external solution clobetasol 0.05 % scalp solution    . clopidogrel (PLAVIX) 75 MG tablet clopidogrel 75 mg tablet  TAKE 1 TABLET EVERY DAY    . Cyanocobalamin 5000 MCG CAPS Take by mouth.    . cyclobenzaprine (FLEXERIL) 10 MG tablet Take 10 mg by mouth daily.     . divalproex (DEPAKOTE ER) 250 MG 24 hr tablet Take 1 tablet (250 mg total) by mouth daily. 30 tablet 11  . Erenumab-aooe (AIMOVIG) 140 MG/ML SOAJ Inject 140 mg into the skin every 30 (thirty) days. 1 pen 11  . finasteride (PROSCAR) 5 MG tablet 1/2 tab daily    . fluticasone (FLONASE) 50 MCG/ACT nasal spray Place into the nose as needed.    .  furosemide (LASIX) 40 MG tablet Take by mouth daily.    Marland Kitchen glucagon (GLUCAGON EMERGENCY) 1 MG injection Glucagon Emergency Kit (human-recomb) 1 mg solution for injection  Take by injection route as needed for 30 days.    . Insulin Glargine (LANTUS SOLOSTAR) 100 UNIT/ML Solostar Pen 12 Units 2 (two) times  a day.     . isosorbide dinitrate (ISORDIL) 30 MG tablet Take by mouth at bedtime.    Marland Kitchen ketoconazole (NIZORAL) 2 % shampoo 3x weekly    . lidocaine-prilocaine (EMLA) cream Apply topically daily.     . metoprolol succinate (TOPROL-XL) 50 MG 24 hr tablet Take 1 tablet (50 mg total) by mouth daily. 90 tablet 3  . metroNIDAZOLE (METROCREAM) 0.75 % cream Apply 1 application topically daily.     . Multiple Vitamins-Minerals (MULTIVITAMIN ADULTS 50+ PO) daily.    . nitroGLYCERIN (NITROSTAT) 0.4 MG SL tablet as needed.    . ondansetron (ZOFRAN ODT) 4 MG disintegrating tablet Take 1 tablet (4 mg total) by mouth every 8 (eight) hours as needed. 20 tablet 6  . pantoprazole (PROTONIX) 40 MG tablet Take 40 mg by mouth daily.    Marland Kitchen REPATHA SURECLICK 161 MG/ML SOAJ [id number]    . Semaglutide (OZEMPIC, 0.25 OR 0.5 MG/DOSE, ) Inject into the skin once a week.    . tizanidine (ZANAFLEX) 2 MG capsule Take 1 capsule (2 mg total) by mouth 3 (three) times daily. 30 capsule 6  . topiramate (TOPAMAX) 100 MG tablet Take 1 tablet (100 mg total) by mouth at bedtime. 90 tablet 4   No current facility-administered medications on file prior to visit.     Cardiovascular studies:  EKG 09/03/2019: Sinus rhythm 76 bpm. Nonspecific T-abnormality.   Carotid artery duplex  08/30/2019: No significant stenosis in the right ICA. Minimal stenosis in the left ICA (1-15%). Very mild atherosclerotic plaque noted bilaterally. Antegrade right vertebral artery flow. Antegrade left vertebral artery flow.  Event monitor 06/06/2019- 07/05/2019:  Dominant rhythm sinus. Max HR 138 bpm, min HR 55 bpm.  40 patient trigerred events with  complaints of lightheadedness/rapid heart beat/shortness of breath/chest pain occur with sinus rhythhm/tachcyardia, with/without PVC.  One episode of "passing out" on 06/23/2019 10:59 bpm with normal sinus rhythm. No Afib/atrial flutter/ventricular tachycardia/sinus pauses/high grade AV blocks seen.   In summary, no causal relation with any specific rhythm.   Echocardiogram 01/11/2019: Left ventricle cavity is normal in size. Mild concentric hypertrophy of the left ventricle. Normal global wall motion. Doppler evidence of grade I (impaired) diastolic dysfunction, normal LAP. Calculated EF 67%. Left atrial cavity is normal in size. Aneurysmal interatrial septum without 2D or color Doppler evidence of interatrial shunt.  Mild mitral valve leaflet thickening. Mild to moderate mitral regurgitation. Mild tricuspid regurgitation. Inadequate TR jet to estimate PA systolic pressure. Normal right atrial pressure.   Coronary angiography 12/09/2017: Indication: Non-STEMI Severe native left main/LAD/left circumflex disease, functionally occluded Patent LIMA to LAD, diffuse disease in native LAD distal to anastomosis Patent SVG-diagonal-OM (?) Severe proximal RCA stenosis proximal to previous stent, and moderate in-stent restenosis (previous 3.0X23 mm BMS)---> successful PCI to RCA with 2 overlapping stents Xience 3.25 x 38 mm, Xience 3.25 X 18 mm DES  Recent labs: 12/04/2018: Glucose132, BUN/creatinine 16.  EGFR 55.  Sodium 140, potassium 50.  Rest the CMP normal. H/H 13/41.  MCV 86.  Platelets 316 Hemoglobin A1c 7.5% Cholesterol 190, triglycerides 98, HDL 72, LDL 98.   Review of Systems  Constitution: Negative for decreased appetite, malaise/fatigue, weight gain and weight loss.  HENT: Negative for congestion.   Eyes: Negative for visual disturbance.  Cardiovascular: Positive for syncope. Negative for chest pain, dyspnea on exertion, leg swelling and palpitations.  Respiratory: Negative for  cough.   Endocrine: Negative for cold intolerance.  Hematologic/Lymphatic: Does not bruise/bleed easily.  Skin: Negative for  itching and rash.  Musculoskeletal: Negative for myalgias.  Gastrointestinal: Negative for abdominal pain, nausea and vomiting.  Genitourinary: Negative for dysuria.  Neurological: Negative for dizziness and weakness.  Psychiatric/Behavioral: The patient is not nervous/anxious.   All other systems reviewed and are negative.       Vitals:   09/26/19 1246  BP: 120/80  Pulse: 78     Body mass index is 31.12 kg/m. Filed Weights   09/26/19 1246  Weight: 187 lb (84.8 kg)     Objective:    Physical Exam   Not performed. Telephone visit.      Assessment & Recommendations:   68 y/o [place] female with coronary artery disease s/p CABG and PCI's, controlled hypertension and hyperlipidemia, after diabetes mellitus, fibromyalgia, migraine headaches, recurrent syncope.  Syncope: No arrhtymogenic etiology identified on event monitor, in spite of having documented episodes of "passing out". Reassuring echocardiogram in 12/2018. No signifciant disease on carotid US 08/2019. Neurocardiogenic syncope remains the most likely differential. In the meantime, recommend liberal hydration and regular use of compression stockings.   CAD: S/p prior CABG and PCI's.  Occasional chest pain and palpitations episodes improved with metoprolol succinate 50 mg daily.  In absence of any bleeding issues, recommend continuing long-term dual antiplatelet therapy in the event of multiple prior revascularization. Continue isosorbide mononitrate 30 mg,  Repatha  Mild MR: Clinically stable.  Hypertension: Controlled  Hyperlipidemia: Continue Repatha.  Type 2 DM: Management as per PCP. She was reportedly taken off Jardiance due to her renal function  Follow up in 3 months after repeat lipid panel.  Nigel Mormon, MD [facility] Pager:  [phone] Office: [phone] If no answer Cell [phone]

## 2019-09-27 DIAGNOSIS — H4323 Crystalline deposits in vitreous body, bilateral: Secondary | ICD-10-CM | POA: Diagnosis not present

## 2019-10-01 DIAGNOSIS — E119 Type 2 diabetes mellitus without complications: Secondary | ICD-10-CM | POA: Diagnosis not present

## 2019-10-01 DIAGNOSIS — N183 Chronic kidney disease, stage 3 unspecified: Secondary | ICD-10-CM | POA: Diagnosis not present

## 2019-10-01 DIAGNOSIS — I10 Essential (primary) hypertension: Secondary | ICD-10-CM | POA: Diagnosis not present

## 2019-10-03 DIAGNOSIS — I10 Essential (primary) hypertension: Secondary | ICD-10-CM | POA: Diagnosis not present

## 2019-10-03 DIAGNOSIS — N390 Urinary tract infection, site not specified: Secondary | ICD-10-CM | POA: Diagnosis not present

## 2019-10-03 DIAGNOSIS — E119 Type 2 diabetes mellitus without complications: Secondary | ICD-10-CM | POA: Diagnosis not present

## 2019-10-16 DIAGNOSIS — H4323 Crystalline deposits in vitreous body, bilateral: Secondary | ICD-10-CM | POA: Diagnosis not present

## 2019-10-16 DIAGNOSIS — H2513 Age-related nuclear cataract, bilateral: Secondary | ICD-10-CM | POA: Diagnosis not present

## 2019-10-16 DIAGNOSIS — E113293 Type 2 diabetes mellitus with mild nonproliferative diabetic retinopathy without macular edema, bilateral: Secondary | ICD-10-CM | POA: Diagnosis not present

## 2019-10-19 DIAGNOSIS — E669 Obesity, unspecified: Secondary | ICD-10-CM | POA: Diagnosis not present

## 2019-10-19 DIAGNOSIS — I10 Essential (primary) hypertension: Secondary | ICD-10-CM | POA: Diagnosis not present

## 2019-10-19 DIAGNOSIS — R1013 Epigastric pain: Secondary | ICD-10-CM | POA: Diagnosis not present

## 2019-10-19 DIAGNOSIS — E1165 Type 2 diabetes mellitus with hyperglycemia: Secondary | ICD-10-CM | POA: Diagnosis not present

## 2019-10-19 DIAGNOSIS — I2510 Atherosclerotic heart disease of native coronary artery without angina pectoris: Secondary | ICD-10-CM | POA: Diagnosis not present

## 2019-10-19 DIAGNOSIS — Z2821 Immunization not carried out because of patient refusal: Secondary | ICD-10-CM | POA: Diagnosis not present

## 2019-11-05 DIAGNOSIS — E119 Type 2 diabetes mellitus without complications: Secondary | ICD-10-CM | POA: Diagnosis not present

## 2019-11-05 DIAGNOSIS — I10 Essential (primary) hypertension: Secondary | ICD-10-CM | POA: Diagnosis not present

## 2019-11-05 DIAGNOSIS — N183 Chronic kidney disease, stage 3 unspecified: Secondary | ICD-10-CM | POA: Diagnosis not present

## 2019-11-08 DIAGNOSIS — E113292 Type 2 diabetes mellitus with mild nonproliferative diabetic retinopathy without macular edema, left eye: Secondary | ICD-10-CM | POA: Diagnosis not present

## 2019-11-08 DIAGNOSIS — E113392 Type 2 diabetes mellitus with moderate nonproliferative diabetic retinopathy without macular edema, left eye: Secondary | ICD-10-CM | POA: Diagnosis not present

## 2019-11-08 DIAGNOSIS — H43392 Other vitreous opacities, left eye: Secondary | ICD-10-CM | POA: Diagnosis not present

## 2019-11-08 DIAGNOSIS — H4322 Crystalline deposits in vitreous body, left eye: Secondary | ICD-10-CM | POA: Diagnosis not present

## 2019-11-20 DIAGNOSIS — H4321 Crystalline deposits in vitreous body, right eye: Secondary | ICD-10-CM | POA: Diagnosis not present

## 2019-11-20 DIAGNOSIS — H43821 Vitreomacular adhesion, right eye: Secondary | ICD-10-CM | POA: Diagnosis not present

## 2019-11-23 DIAGNOSIS — I2510 Atherosclerotic heart disease of native coronary artery without angina pectoris: Secondary | ICD-10-CM | POA: Diagnosis not present

## 2019-11-23 DIAGNOSIS — R1013 Epigastric pain: Secondary | ICD-10-CM | POA: Diagnosis not present

## 2019-11-23 DIAGNOSIS — Z2821 Immunization not carried out because of patient refusal: Secondary | ICD-10-CM | POA: Diagnosis not present

## 2019-11-23 DIAGNOSIS — E669 Obesity, unspecified: Secondary | ICD-10-CM | POA: Diagnosis not present

## 2019-11-23 DIAGNOSIS — I10 Essential (primary) hypertension: Secondary | ICD-10-CM | POA: Diagnosis not present

## 2019-11-23 DIAGNOSIS — E1165 Type 2 diabetes mellitus with hyperglycemia: Secondary | ICD-10-CM | POA: Diagnosis not present

## 2019-11-27 DIAGNOSIS — N390 Urinary tract infection, site not specified: Secondary | ICD-10-CM | POA: Diagnosis not present

## 2019-11-27 DIAGNOSIS — I10 Essential (primary) hypertension: Secondary | ICD-10-CM | POA: Diagnosis not present

## 2019-11-27 DIAGNOSIS — E119 Type 2 diabetes mellitus without complications: Secondary | ICD-10-CM | POA: Diagnosis not present

## 2019-11-30 DIAGNOSIS — E875 Hyperkalemia: Secondary | ICD-10-CM | POA: Diagnosis not present

## 2019-11-30 DIAGNOSIS — E1165 Type 2 diabetes mellitus with hyperglycemia: Secondary | ICD-10-CM | POA: Diagnosis not present

## 2019-11-30 DIAGNOSIS — Z2821 Immunization not carried out because of patient refusal: Secondary | ICD-10-CM | POA: Diagnosis not present

## 2019-12-04 ENCOUNTER — Ambulatory Visit: Payer: Self-pay | Admitting: Neurology

## 2019-12-06 ENCOUNTER — Other Ambulatory Visit: Payer: Self-pay | Admitting: Cardiology

## 2019-12-06 DIAGNOSIS — R600 Localized edema: Secondary | ICD-10-CM

## 2019-12-28 ENCOUNTER — Other Ambulatory Visit: Payer: Self-pay

## 2019-12-28 ENCOUNTER — Telehealth (INDEPENDENT_AMBULATORY_CARE_PROVIDER_SITE_OTHER): Payer: Medicare Other | Admitting: Cardiology

## 2019-12-28 VITALS — HR 128

## 2019-12-28 DIAGNOSIS — I25118 Atherosclerotic heart disease of native coronary artery with other forms of angina pectoris: Secondary | ICD-10-CM | POA: Diagnosis not present

## 2019-12-28 DIAGNOSIS — R000 Tachycardia, unspecified: Secondary | ICD-10-CM

## 2019-12-28 NOTE — Progress Notes (Signed)
Subjective:   Holly Nichols, female    DOB: 12/15/1951, 69 y.o.   MRN: [id number]   I connected with the patient on 12/27/2018 by a telephone call and verified that I am speaking with the correct person using two identifiers.     I offered the patient a video enabled application for a virtual visit. Unfortunately, this could not be accomplished due to technical difficulties/lack of video enabled phone/computer. I discussed the limitations of evaluation and management by telemedicine and the availability of in person appointments. The patient expressed understanding and agreed to proceed.   This visit type was conducted due to national recommendations for restrictions regarding the COVID-19 Pandemic (e.g. social distancing).  This format is felt to be most appropriate for this patient at this time.  All issues noted in this document were discussed and addressed.  No physical exam was performed (except for noted visual exam findings with Tele health visits).  The patient has consented to conduct a Tele health visit and understands insurance will be billed.   Chief complaint:  Chest pain   HPI  69 y/o [place] American female with coronary artery disease s/p CABG and PCI's, controlled hypertension and hyperlipidemia, after diabetes mellitus, fibromyalgia, migraine headaches, recurrent syncope.  Patient reports that her HR has been staying elevated for last few weeks. HR is 128 today. She denies chest pain, shortness of breath, leg edema, orthopnea, PND, TIA/syncope.    Current Outpatient Medications on File Prior to Visit  Medication Sig Dispense Refill  . aspirin 81 MG EC tablet Enteric Coated Aspirin 81 mg tablet,delayed release  Take 1 tablet every day by oral route.    . Biotin 10000 MCG TABS biotin  Take one tablet by mouth daily.    . butalbital-acetaminophen-caffeine (FIORICET) 50-325-40 MG tablet Take 1 tablet by mouth every 6 (six) hours as needed for headache. Do not take more  than 10 tablets per month. 10 tablet 5  . Cholecalciferol (VITAMIN D3) 125 MCG (5000 UT) TBDP Take by mouth.    . clobetasol (TEMOVATE) 0.05 % external solution clobetasol 0.05 % scalp solution    . clopidogrel (PLAVIX) 75 MG tablet clopidogrel 75 mg tablet  TAKE 1 TABLET EVERY DAY    . Cyanocobalamin 5000 MCG CAPS Take by mouth.    . cyclobenzaprine (FLEXERIL) 10 MG tablet Take 10 mg by mouth daily.     . divalproex (DEPAKOTE ER) 250 MG 24 hr tablet Take 1 tablet (250 mg total) by mouth daily. 30 tablet 11  . Erenumab-aooe (AIMOVIG) 140 MG/ML SOAJ Inject 140 mg into the skin every 30 (thirty) days. 1 pen 11  . finasteride (PROSCAR) 5 MG tablet 1/2 tab daily    . fluticasone (FLONASE) 50 MCG/ACT nasal spray Place into the nose as needed.    . furosemide (LASIX) 40 MG tablet TAKE 1 TABLET BY MOUTH EVERY DAY 90 tablet 3  . glucagon (GLUCAGON EMERGENCY) 1 MG injection Glucagon Emergency Kit (human-recomb) 1 mg solution for injection  Take by injection route as needed for 30 days.    . isosorbide dinitrate (ISORDIL) 30 MG tablet Take by mouth at bedtime.    Marland Kitchen ketoconazole (NIZORAL) 2 % shampoo 3x weekly    . lidocaine-prilocaine (EMLA) cream Apply topically daily.     . metoprolol succinate (TOPROL-XL) 50 MG 24 hr tablet Take 1 tablet (50 mg total) by mouth daily. 90 tablet 3  . metroNIDAZOLE (METROCREAM) 0.75 % cream Apply 1 application topically daily.     Marland Kitchen  Multiple Vitamins-Minerals (MULTIVITAMIN ADULTS 50+ PO) daily.    . nitroGLYCERIN (NITROSTAT) 0.4 MG SL tablet as needed.    . ondansetron (ZOFRAN ODT) 4 MG disintegrating tablet Take 1 tablet (4 mg total) by mouth every 8 (eight) hours as needed. 20 tablet 6  . pantoprazole (PROTONIX) 40 MG tablet Take 40 mg by mouth daily.    Marland Kitchen REPATHA SURECLICK 024 MG/ML SOAJ [id number]    . Semaglutide (OZEMPIC, 0.25 OR 0.5 MG/DOSE, [place]) Inject into the skin once a week.    . tizanidine (ZANAFLEX) 2 MG capsule Take 1 capsule (2 mg total) by mouth 3 (three)  times daily. (Patient taking differently: Take 2 mg by mouth 3 (three) times daily as needed. ) 30 capsule 6  . topiramate (TOPAMAX) 100 MG tablet Take 1 tablet (100 mg total) by mouth at bedtime. 90 tablet 4   No current facility-administered medications on file prior to visit.    Cardiovascular studies:  EKG 09/03/2019: Sinus rhythm 76 bpm. Nonspecific T-abnormality.   Carotid artery duplex  08/30/2019: No significant stenosis in the right ICA. Minimal stenosis in the left ICA (1-15%). Very mild atherosclerotic plaque noted bilaterally. Antegrade right vertebral artery flow. Antegrade left vertebral artery flow.  Event monitor 06/06/2019- 07/05/2019:  Dominant rhythm sinus. Max HR 138 bpm, min HR 55 bpm.  40 patient trigerred events with complaints of lightheadedness/rapid heart beat/shortness of breath/chest pain occur with sinus rhythhm/tachcyardia, with/without PVC.  One episode of "passing out" on 06/23/2019 10:59 bpm with normal sinus rhythm. No Afib/atrial flutter/ventricular tachycardia/sinus pauses/high grade AV blocks seen.   In summary, no causal relation with any specific rhythm.   Echocardiogram 01/11/2019: Left ventricle cavity is normal in size. Mild concentric hypertrophy of the left ventricle. Normal global wall motion. Doppler evidence of grade I (impaired) diastolic dysfunction, normal LAP. Calculated EF 67%. Left atrial cavity is normal in size. Aneurysmal interatrial septum without 2D or color Doppler evidence of interatrial shunt.  Mild mitral valve leaflet thickening. Mild to moderate mitral regurgitation. Mild tricuspid regurgitation. Inadequate TR jet to estimate PA systolic pressure. Normal right atrial pressure.   Coronary angiography 12/09/2017: Indication: Non-STEMI Severe native left main/LAD/left circumflex disease, functionally occluded Patent LIMA to LAD, diffuse disease in native LAD distal to anastomosis Patent SVG-diagonal-OM (?) Severe  proximal RCA stenosis proximal to previous stent, and moderate in-stent restenosis (previous 3.0X23 mm BMS)---> successful PCI to RCA with 2 overlapping stents Xience 3.25 x 38 mm, Xience 3.25 X 18 mm DES  Recent labs: 12/04/2018: Glucose132, BUN/creatinine 16.  EGFR 55.  Sodium 140, potassium 50.  Rest the CMP normal. H/H 13/41.  MCV 86.  Platelets 316 Hemoglobin A1c 7.5% Cholesterol 190, triglycerides 98, HDL 72, LDL 98.   Review of Systems  Cardiovascular: Negative for chest pain, dyspnea on exertion, leg swelling, palpitations and syncope.       Tachycardia        Vitals:   12/28/19 1250  Pulse: (!) 128     Objective:    Physical Exam Not performed. Telephone visit.      Assessment & Recommendations:   69 y/o [place] American female with coronary artery disease s/p CABG and PCI's, controlled hypertension and hyperlipidemia, after diabetes mellitus, fibromyalgia, migraine headaches, recurrent syncope, now with tachycardia.  Tachycardia: Relatively asymptomatic. I offered her an in office visit to perform EKG, but she is unable to do so today. Will need in office visit for physical exam, EKG, and Holter monitor.   CAD: S/p prior CABG  and PCI's.  Occasional chest pain and palpitations episodes improved with metoprolol succinate 50 mg daily.  In absence of any bleeding issues, recommend continuing long-term dual antiplatelet therapy in the event of multiple prior revascularization. Continue isosorbide mononitrate 30 mg,  Repatha  Mild MR: Clinically stable.  Hypertension: Controlled  Hyperlipidemia: Continue Repatha.  Type 2 DM: Management as per PCP. She was reportedly taken off Jardiance due to her renal function   Nigel Mormon, MD [facility] [place] Pager: [phone] Office: [phone] If no answer Cell [phone]

## 2020-01-01 ENCOUNTER — Other Ambulatory Visit: Payer: Self-pay

## 2020-01-01 ENCOUNTER — Ambulatory Visit: Payer: Medicare Other

## 2020-01-01 ENCOUNTER — Encounter: Payer: Self-pay | Admitting: Cardiology

## 2020-01-01 ENCOUNTER — Ambulatory Visit (INDEPENDENT_AMBULATORY_CARE_PROVIDER_SITE_OTHER): Payer: Medicare Other | Admitting: Cardiology

## 2020-01-01 ENCOUNTER — Ambulatory Visit: Payer: Medicare Other | Admitting: Cardiology

## 2020-01-01 VITALS — BP 140/87 | HR 102 | Temp 97.3°F | Resp 16 | Ht 65.0 in | Wt 188.3 lb

## 2020-01-01 DIAGNOSIS — R000 Tachycardia, unspecified: Secondary | ICD-10-CM

## 2020-01-01 DIAGNOSIS — E782 Mixed hyperlipidemia: Secondary | ICD-10-CM | POA: Diagnosis not present

## 2020-01-01 DIAGNOSIS — I25118 Atherosclerotic heart disease of native coronary artery with other forms of angina pectoris: Secondary | ICD-10-CM | POA: Diagnosis not present

## 2020-01-01 MED ORDER — ISOSORBIDE DINITRATE 30 MG PO TABS: 30.0000 mg | ORAL_TABLET | Freq: Every day | ORAL | 3 refills | Status: AC

## 2020-01-01 NOTE — Progress Notes (Signed)
Subjective:   Holly Nichols, female    DOB: Jul 17, 1951, 69 y.o.   MRN: [id number]  Chief complaint:  Chest pain  69 y/o [place] American female with coronary artery disease s/p CABG and PCI's, controlled hypertension and hyperlipidemia, after diabetes mellitus, fibromyalgia, migraine headaches, recurrent syncope.  Patient reports that her HR has been staying elevated for last few weeks. HR is 128 today. She denies chest pain, shortness of breath, leg edema, orthopnea, PND, TIA/syncope.    Current Outpatient Medications on File Prior to Visit  Medication Sig Dispense Refill  . aspirin 81 MG EC tablet Enteric Coated Aspirin 81 mg tablet,delayed release  Take 1 tablet every day by oral route.    . Biotin 10000 MCG TABS biotin  Take one tablet by mouth daily.    . Cholecalciferol (VITAMIN D3) 125 MCG (5000 UT) TBDP Take by mouth.    . clobetasol (TEMOVATE) 0.05 % external solution clobetasol 0.05 % scalp solution    . clopidogrel (PLAVIX) 75 MG tablet clopidogrel 75 mg tablet  TAKE 1 TABLET EVERY DAY    . Cyanocobalamin 5000 MCG CAPS Take by mouth.    . cyclobenzaprine (FLEXERIL) 10 MG tablet Take 10 mg by mouth daily.     . divalproex (DEPAKOTE ER) 250 MG 24 hr tablet Take 1 tablet (250 mg total) by mouth daily. 30 tablet 11  . Erenumab-aooe (AIMOVIG) 140 MG/ML SOAJ Inject 140 mg into the skin every 30 (thirty) days. 1 pen 11  . fluticasone (FLONASE) 50 MCG/ACT nasal spray Place into the nose as needed.    Marland Kitchen glucagon (GLUCAGON EMERGENCY) 1 MG injection Glucagon Emergency Kit (human-recomb) 1 mg solution for injection  Take by injection route as needed for 30 days.    . isosorbide dinitrate (ISORDIL) 30 MG tablet Take by mouth at bedtime.    Marland Kitchen ketoconazole (NIZORAL) 2 % shampoo 3x weekly    . lidocaine-prilocaine (EMLA) cream Apply topically daily.     . metoprolol succinate (TOPROL-XL) 50 MG 24 hr tablet Take 1 tablet (50 mg total) by mouth daily. 90 tablet 3  . metroNIDAZOLE  (METROCREAM) 0.75 % cream Apply 1 application topically daily.     . Multiple Vitamins-Minerals (MULTIVITAMIN ADULTS 50+ PO) daily.    . nitroGLYCERIN (NITROSTAT) 0.4 MG SL tablet as needed.    Marland Kitchen REPATHA SURECLICK 409 MG/ML SOAJ [id number]    . Semaglutide (OZEMPIC, 0.25 OR 0.5 MG/DOSE, [place]) Inject into the skin once a week.    . topiramate (TOPAMAX) 100 MG tablet Take 1 tablet (100 mg total) by mouth at bedtime. 90 tablet 4   No current facility-administered medications on file prior to visit.    Cardiovascular studies:  EKG 01/01/2020: Sinus rhythm 93 bpm. Left atrial enlargement.  Diffuse nonspecific T-abnormality.   Carotid artery duplex  08/30/2019: No significant stenosis in the right ICA. Minimal stenosis in the left ICA (1-15%). Very mild atherosclerotic plaque noted bilaterally. Antegrade right vertebral artery flow. Antegrade left vertebral artery flow.  Event monitor 06/06/2019- 07/05/2019:  Dominant rhythm sinus. Max HR 138 bpm, min HR 55 bpm.  40 patient trigerred events with complaints of lightheadedness/rapid heart beat/shortness of breath/chest pain occur with sinus rhythhm/tachcyardia, with/without PVC.  One episode of "passing out" on 06/23/2019 10:59 bpm with normal sinus rhythm. No Afib/atrial flutter/ventricular tachycardia/sinus pauses/high grade AV blocks seen.   In summary, no causal relation with any specific rhythm.   Echocardiogram 01/11/2019: Left ventricle cavity is normal in size. Mild concentric hypertrophy of  the left ventricle. Normal global wall motion. Doppler evidence of grade I (impaired) diastolic dysfunction, normal LAP. Calculated EF 67%. Left atrial cavity is normal in size. Aneurysmal interatrial septum without 2D or color Doppler evidence of interatrial shunt.  Mild mitral valve leaflet thickening. Mild to moderate mitral regurgitation. Mild tricuspid regurgitation. Inadequate TR jet to estimate PA systolic pressure. Normal right atrial  pressure.   Coronary angiography 12/09/2017: Indication: Non-STEMI Severe native left main/LAD/left circumflex disease, functionally occluded Patent LIMA to LAD, diffuse disease in native LAD distal to anastomosis Patent SVG-diagonal-OM (?) Severe proximal RCA stenosis proximal to previous stent, and moderate in-stent restenosis (previous 3.0X23 mm BMS)---> successful PCI to RCA with 2 overlapping stents Xience 3.25 x 38 mm, Xience 3.25 X 18 mm DES  Recent labs: 11/23/2019: Glucose 105, BUN/Cr 11/0.94. EGFR 73. Na/K 141/5.6. Rest of the CMP normal HbA1C 6.5% Chol 158, TG 101, HDL 63, LDL 77  12/04/2018: Glucose132, BUN/creatinine 16.  EGFR 55.  Sodium 140, potassium 50.  Rest the CMP normal. H/H 13/41.  MCV 86.  Platelets 316 Hemoglobin A1c 7.5% Cholesterol 190, triglycerides 98, HDL 72, LDL 98.   Review of Systems  Cardiovascular: Negative for chest pain, dyspnea on exertion, leg swelling, palpitations and syncope.       Tachycardia        Vitals:   01/01/20 1102  BP: 140/87  Pulse: (!) 102  Resp: 16  Temp: (!) 97.3 F (36.3 C)  SpO2: 98%     Objective:    Physical Exam  Constitutional: She appears well-developed and well-nourished.  Neck: No JVD present.  Cardiovascular: Normal rate, regular rhythm, normal heart sounds and intact distal pulses.  No murmur heard. Pulmonary/Chest: Effort normal and breath sounds normal. She has no wheezes. She has no rales.  Musculoskeletal:        General: No edema.  Nursing note and vitals reviewed.     Assessment & Recommendations:   69 y/o [place] American female with coronary artery disease s/p CABG and PCI's, controlled hypertension and hyperlipidemia, after diabetes mellitus, fibromyalgia, migraine headaches, recurrent syncope, now with tachycardia.  Tachycardia: Subjective. EKG normal today. Will check [place] rmonitor. If resting HR is elevated, will check CBC and TSH.   CAD: S/p prior CABG and PCI's.   Occasional chest pain and palpitations episodes improved with metoprolol succinate 50 mg daily.  In absence of any bleeding issues, recommend continuing long-term dual antiplatelet therapy in the event of multiple prior revascularization. Continue isosorbide mononitrate 30 mg. Reviewed lipid panel form PCP.LDL 77. Continue statin and  Repatha.  Mild MR: Clinically stable.  Hypertension: Controlled  Hyperlipidemia: Continue Repatha.  Type 2 DM: Management as per PCP. She was reportedly taken off Jardiance due to her renal function   Nigel Mormon, MD [facility] [place] Pager: [phone] Office: [phone] If no answer Cell [phone]

## 2020-01-03 ENCOUNTER — Other Ambulatory Visit: Payer: Self-pay | Admitting: Cardiology

## 2020-01-03 DIAGNOSIS — R000 Tachycardia, unspecified: Secondary | ICD-10-CM | POA: Diagnosis not present

## 2020-01-03 DIAGNOSIS — I25118 Atherosclerotic heart disease of native coronary artery with other forms of angina pectoris: Secondary | ICD-10-CM

## 2020-01-03 MED ORDER — NITROGLYCERIN 0.4 MG SL SUBL: 0.4000 mg | SUBLINGUAL_TABLET | SUBLINGUAL | 2 refills | Status: DC | PRN

## 2020-01-03 NOTE — Telephone Encounter (Signed)
From patient.

## 2020-01-07 ENCOUNTER — Ambulatory Visit: Payer: Medicare Other | Attending: Internal Medicine

## 2020-01-07 DIAGNOSIS — Z20822 Contact with and (suspected) exposure to covid-19: Secondary | ICD-10-CM | POA: Diagnosis not present

## 2020-01-08 ENCOUNTER — Other Ambulatory Visit: Payer: Medicare Other

## 2020-01-08 LAB — NOVEL CORONAVIRUS, NAA: SARS-CoV-2, NAA: DETECTED — AB

## 2020-01-09 ENCOUNTER — Telehealth: Payer: Self-pay | Admitting: Infectious Diseases

## 2020-01-09 DIAGNOSIS — U071 COVID-19: Secondary | ICD-10-CM | POA: Diagnosis not present

## 2020-01-09 NOTE — Telephone Encounter (Signed)
Called to discuss with patient about Covid symptoms and the use of bamlanivimab, a monoclonal antibody infusion for those with mild to moderate Covid symptoms and at a high risk of hospitalization.  Pt is qualified for this infusion at the [facility] infusion center due to Age > 65.   She was around her cousin who was recently taken to the hospital Friday and found to be COVID+. She is currently on a ventilator for support.  Her cousin has been sick since January 2 and living in the same home but trying to stay away from each other. Last contact with her was Friday. Holly Nichols is asymptomatic now. Will give information re: infusion should she develop symptoms but is not a candidate at this time.

## 2020-01-16 ENCOUNTER — Telehealth (INDEPENDENT_AMBULATORY_CARE_PROVIDER_SITE_OTHER): Payer: Medicare Other | Admitting: Cardiology

## 2020-01-16 VITALS — BP 136/93 | HR 90

## 2020-01-16 DIAGNOSIS — E782 Mixed hyperlipidemia: Secondary | ICD-10-CM

## 2020-01-16 DIAGNOSIS — R000 Tachycardia, unspecified: Secondary | ICD-10-CM

## 2020-01-16 DIAGNOSIS — I25118 Atherosclerotic heart disease of native coronary artery with other forms of angina pectoris: Secondary | ICD-10-CM

## 2020-01-16 NOTE — Progress Notes (Signed)
Subjective:   Holly Nichols, female    DOB: Mar 11, 1951, 69 y.o.   MRN: [id number]  Chief complaint:  Chest pain  69 y/o [place] American female with coronary artery disease s/p CABG and PCI's, controlled hypertension and hyperlipidemia, after diabetes mellitus, fibromyalgia, migraine headaches, recurrent syncope, now with tachycardia.  Patient has episodes of tachycardia with fatigue.  Holter monitor shows heart rate in appropriate range, without any serious arrhythmias.  She does not have any anginal symptoms.  Patient was tested positive for Covid on January 18.  Does not have any symptoms anymore.     Current Outpatient Medications on File Prior to Visit  Medication Sig Dispense Refill  . aspirin 81 MG EC tablet Enteric Coated Aspirin 81 mg tablet,delayed release  Take 1 tablet every day by oral route.    . Biotin 10000 MCG TABS biotin  Take one tablet by mouth daily.    . Cholecalciferol (VITAMIN D3) 125 MCG (5000 UT) TBDP Take by mouth.    . clobetasol (TEMOVATE) 0.05 % external solution clobetasol 0.05 % scalp solution    . clopidogrel (PLAVIX) 75 MG tablet clopidogrel 75 mg tablet  TAKE 1 TABLET EVERY DAY    . Cyanocobalamin 5000 MCG CAPS Take by mouth.    . cyclobenzaprine (FLEXERIL) 10 MG tablet Take 10 mg by mouth daily.     . divalproex (DEPAKOTE ER) 250 MG 24 hr tablet Take 1 tablet (250 mg total) by mouth daily. 30 tablet 11  . DUREZOL 0.05 % EMUL Place 1 drop into the left eye 2 (two) times daily.    Eduard Roux (AIMOVIG) 140 MG/ML SOAJ Inject 140 mg into the skin every 30 (thirty) days. 1 pen 11  . fluticasone (FLONASE) 50 MCG/ACT nasal spray Place into the nose as needed.    Marland Kitchen glucagon (GLUCAGON EMERGENCY) 1 MG injection Glucagon Emergency Kit (human-recomb) 1 mg solution for injection  Take by injection route as needed for 30 days.    . isosorbide dinitrate (ISORDIL) 30 MG tablet Take 1 tablet (30 mg total) by mouth at bedtime. 90 tablet 3  . ketoconazole  (NIZORAL) 2 % shampoo 3x weekly    . lidocaine-prilocaine (EMLA) cream Apply topically daily.     . metoprolol succinate (TOPROL-XL) 50 MG 24 hr tablet Take 1 tablet (50 mg total) by mouth daily. 90 tablet 3  . metroNIDAZOLE (METROCREAM) 0.75 % cream Apply 1 application topically daily.     . Multiple Vitamins-Minerals (MULTIVITAMIN ADULTS 50+ PO) daily.    . nitroGLYCERIN (NITROSTAT) 0.4 MG SL tablet Place 1 tablet (0.4 mg total) under the tongue every 5 (five) minutes as needed. 30 tablet 2  . ofloxacin (OCUFLOX) 0.3 % ophthalmic solution Place 1 drop into the left eye 4 (four) times daily.    Marland Kitchen REPATHA SURECLICK 025 MG/ML SOAJ [id number]    . Semaglutide (OZEMPIC, 0.25 OR 0.5 MG/DOSE, [place]) Inject into the skin once a week.     No current facility-administered medications on file prior to visit.    Cardiovascular studies:  48 hr Holter monitor 01/02/2020:  Dominant rhythm sinus.  HR 67-128 bpm. Avg HR 83 bpm.  <1% PAC/PVC burden  No atrial fibrillation/atrial flutter/SVT/VT/high grade AV block, sinus pause >3sec noted.  Symptoms reported: None   EKG 01/01/2020: Sinus rhythm 93 bpm. Left atrial enlargement.  Diffuse nonspecific T-abnormality.   Carotid artery duplex  08/30/2019: No significant stenosis in the right ICA. Minimal stenosis in the left ICA (1-15%). Very mild atherosclerotic  plaque noted bilaterally. Antegrade right vertebral artery flow. Antegrade left vertebral artery flow.  Event monitor 06/06/2019- 07/05/2019:  Dominant rhythm sinus. Max HR 138 bpm, min HR 55 bpm.  40 patient trigerred events with complaints of lightheadedness/rapid heart beat/shortness of breath/chest pain occur with sinus rhythhm/tachcyardia, with/without PVC.  One episode of "passing out" on 06/23/2019 10:59 bpm with normal sinus rhythm. No Afib/atrial flutter/ventricular tachycardia/sinus pauses/high grade AV blocks seen.   In summary, no causal relation with any specific rhythm.    Echocardiogram 01/11/2019: Left ventricle cavity is normal in size. Mild concentric hypertrophy of the left ventricle. Normal global wall motion. Doppler evidence of grade I (impaired) diastolic dysfunction, normal LAP. Calculated EF 67%. Left atrial cavity is normal in size. Aneurysmal interatrial septum without 2D or color Doppler evidence of interatrial shunt.  Mild mitral valve leaflet thickening. Mild to moderate mitral regurgitation. Mild tricuspid regurgitation. Inadequate TR jet to estimate PA systolic pressure. Normal right atrial pressure.   Coronary angiography 12/09/2017: Indication: Non-STEMI Severe native left main/LAD/left circumflex disease, functionally occluded Patent LIMA to LAD, diffuse disease in native LAD distal to anastomosis Patent SVG-diagonal-OM (?) Severe proximal RCA stenosis proximal to previous stent, and moderate in-stent restenosis (previous 3.0X23 mm BMS)---> successful PCI to RCA with 2 overlapping stents Xience 3.25 x 38 mm, Xience 3.25 X 18 mm DES  Recent labs: 11/23/2019: Glucose 105, BUN/Cr 11/0.94. EGFR 73. Na/K 141/5.6. Rest of the CMP normal HbA1C 6.5% Chol 158, TG 101, HDL 63, LDL 77  12/04/2018: Glucose132, BUN/creatinine 16.  EGFR 55.  Sodium 140, potassium 50.  Rest the CMP normal. H/H 13/41.  MCV 86.  Platelets 316 Hemoglobin A1c 7.5% Cholesterol 190, triglycerides 98, HDL 72, LDL 98.   Review of Systems  Cardiovascular: Negative for chest pain, dyspnea on exertion, leg swelling, palpitations and syncope.       Tachycardia        Vitals:   01/07/20 1351 01/16/20 1351  BP: 104/79 (!) 136/93  Pulse: (!) 123 90     Objective:    Physical Exam  Constitutional: She appears well-developed and well-nourished.  Neck: No JVD present.  Cardiovascular: Normal rate, regular rhythm, normal heart sounds and intact distal pulses.  No murmur heard. Pulmonary/Chest: Effort normal and breath sounds normal. She has no wheezes. She has no  rales.  Musculoskeletal:        General: No edema.  Nursing note and vitals reviewed.     Assessment & Recommendations:   69 y/o [place] female with coronary artery disease s/p CABG and PCI's, controlled hypertension and hyperlipidemia, after diabetes mellitus, fibromyalgia, migraine headaches, recurrent syncope, now with tachycardia.  Tachycardia: Subjective. Normal EKG. No serious arrhythmia on event and Holter monitor.   CAD: S/p prior CABG and PCI's.  No angina symptoms. Continue plavix without Aspirin.  Continue metoprolol sucinate 50, isosorbide mononitrate 30 mg. Reviewed lipid panel form PCP.LDL 77. Continue statin and  Repatha.  Mild MR: Clinically stable.  Hypertension: Controlled  Hyperlipidemia: Continue Repatha.  Type 2 DM: Management as per PCP. She was reportedly taken off Jardiance due to her renal function  F/u in 6 months.  Nigel Mormon, MD [facility] [place] Pager: [phone] Office: [phone] If no answer Cell [phone]

## 2020-01-27 ENCOUNTER — Other Ambulatory Visit: Payer: Self-pay | Admitting: Cardiology

## 2020-01-27 DIAGNOSIS — I25118 Atherosclerotic heart disease of native coronary artery with other forms of angina pectoris: Secondary | ICD-10-CM

## 2020-02-25 DIAGNOSIS — R1013 Epigastric pain: Secondary | ICD-10-CM | POA: Diagnosis not present

## 2020-02-25 DIAGNOSIS — E669 Obesity, unspecified: Secondary | ICD-10-CM | POA: Diagnosis not present

## 2020-02-25 DIAGNOSIS — I2510 Atherosclerotic heart disease of native coronary artery without angina pectoris: Secondary | ICD-10-CM | POA: Diagnosis not present

## 2020-02-25 DIAGNOSIS — E1165 Type 2 diabetes mellitus with hyperglycemia: Secondary | ICD-10-CM | POA: Diagnosis not present

## 2020-02-25 DIAGNOSIS — M25562 Pain in left knee: Secondary | ICD-10-CM | POA: Diagnosis not present

## 2020-02-25 DIAGNOSIS — I10 Essential (primary) hypertension: Secondary | ICD-10-CM | POA: Diagnosis not present

## 2020-02-27 ENCOUNTER — Other Ambulatory Visit: Payer: Self-pay

## 2020-02-27 MED ORDER — CLOPIDOGREL BISULFATE 75 MG PO TABS: ORAL_TABLET | ORAL | 0 refills | Status: AC

## 2020-02-27 NOTE — Progress Notes (Addendum)
PATIENT: Holly Nichols DOB: 09-04-1951  REASON FOR VISIT: follow up HISTORY FROM: patient  HISTORY OF PRESENT ILLNESS: Today 02/28/20  HISTORY  Holly Nichols is a 69 years old female, seen in request by her primary care physician Dr. Benito Mccreedy for evaluation of headache, initial evaluations was through virtual visit on June 19 2019.  I have reviewed and summarized the referring note from the referring physician. She has PMHx of DM since 2996, CAD, CABG, chronic migraine.   She recently moved from [place] to [place] from Nov 2019, previously she was under the care of neurologist for migraine headaches and low back pain. She was given Topamax and Depakote, which has a migraine headache under reasonable control, she was having migraines about 2-3 times each month,  Since she moved to [place], her Topamax and Depakote was stopped, she now have increased migraine headaches, 2-3 times each week, she complains of right lateralized severe pounding headache with associated light noise sensitivity, nauseous, lasting for hours to days, she has tried Tylenol Aleve Advil, which has limited help for her headaches  She also complains of worsening low back pain, is using lidocaine patch, heating pad  UPDATE August 24th 2020: She was started on Aimovig 140 mg injection since last visit, also taking Topamax 100 mg every night, she reported only mild improvement, came in today with sunglasses on, reported 3 weeks history of 9 out of 10 daily headaches, failed home remedy, multiple dose of Fioricet  Laboratory evaluation in January 2020 showed creatinine of 0.95,  Update February 28, 2020 SS: At last visit, was given trigger point injection, added Depakote ER 250 mg QHS. Stopped Topamax, was bothering her kidneys. She is getting 3 headaches a week, used to be daily. Tolerating Aimovig well. Headaches are mild, moderate. She will take Fiorcet, it will ease it up. Is able to go about daily  activities. She is glad they aren't everyday, wishes they weren't as frequent, started migraine in 2017. Bright lights, loud noises are triggers. Usually frontal. She is seeing nephrology, got good report, seeing again in 6 months.   She says fainting spells since 2017 if she walks and turns her head left, not related to headaches. If she is walking, eyes roll back, she will fall into something, or bump into someone, will bring her out of it. She says she passes out and hits the ground 3 times last year, cardiology could not determine etiology. Mostly with turning her head to the left. She lives alone, her cousin passed away in the last year. Saw Dr. Posey Pronto at [facility] June 2020 for passing out episodes.   REVIEW OF SYSTEMS: Out of a complete 14 system review of symptoms, the patient complains only of the following symptoms, and all other reviewed systems are negative.  Headache, fainting spells  ALLERGIES: Allergies  Allergen Reactions  . Acetaminophen Other (See Comments)    Heart palpitations  . Aspirin Other (See Comments)    Stomach ulcers  . Codeine Other (See Comments)  . Depakote [Divalproex Sodium] Other (See Comments)    Kidney issues  . Hydroxychloroquine Other (See Comments)  . Hydroxychloroquine Sulfate   . Lithium Other (See Comments)  . Morphine Nausea Only, Nausea And Vomiting and Other (See Comments)  . Nortriptyline Other (See Comments)  . Nsaids Other (See Comments)    Heart palpitations  . Penicillin G Hives  . Prednisone Other (See Comments)    Heart palpation   . Statins Other (See  Comments)  . Tape Other (See Comments)  . Topiramate Other (See Comments)    Kidney issues  . Tramadol Other (See Comments)    Heart palpatations  . Lisinopril Rash  . Thimerosal Rash    HOME MEDICATIONS: Outpatient Medications Prior to Visit  Medication Sig Dispense Refill  . Biotin 10000 MCG TABS biotin  Take one tablet by mouth daily.    . Cholecalciferol  (VITAMIN D3) 125 MCG (5000 UT) TBDP Take by mouth.    . clobetasol (TEMOVATE) 0.05 % external solution clobetasol 0.05 % scalp solution    . clopidogrel (PLAVIX) 75 MG tablet clopidogrel 75 mg tablet  TAKE 1 TABLET EVERY DAY 90 tablet 0  . Cyanocobalamin 5000 MCG CAPS Take by mouth.    . cyclobenzaprine (FLEXERIL) 10 MG tablet Take 10 mg by mouth daily.     . divalproex (DEPAKOTE ER) 250 MG 24 hr tablet Take 1 tablet (250 mg total) by mouth daily. 30 tablet 11  . doxycycline (VIBRAMYCIN) 50 MG capsule Take 50 mg by mouth daily.    . DUREZOL 0.05 % EMUL Place 1 drop into the left eye 2 (two) times daily.    Eduard Roux (AIMOVIG) 140 MG/ML SOAJ Inject 140 mg into the skin every 30 (thirty) days. 1 pen 11  . fluticasone (FLONASE) 50 MCG/ACT nasal spray Place into the nose as needed.    Marland Kitchen glucagon (GLUCAGON EMERGENCY) 1 MG injection Glucagon Emergency Kit (human-recomb) 1 mg solution for injection  Take by injection route as needed for 30 days.    . isosorbide dinitrate (ISORDIL) 30 MG tablet Take 1 tablet (30 mg total) by mouth at bedtime. 90 tablet 3  . ketoconazole (NIZORAL) 2 % shampoo 3x weekly    . lidocaine-prilocaine (EMLA) cream Apply topically daily.     . metoprolol succinate (TOPROL-XL) 50 MG 24 hr tablet Take 1 tablet (50 mg total) by mouth daily. 90 tablet 3  . metroNIDAZOLE (METROCREAM) 0.75 % cream Apply 1 application topically daily.     . Multiple Vitamins-Minerals (MULTIVITAMIN ADULTS 50+ PO) daily.    . nitroGLYCERIN (NITROSTAT) 0.4 MG SL tablet PLACE 1 TABLET (0.4 MG TOTAL) UNDER THE TONGUE EVERY 5 (FIVE) MINUTES AS NEEDED. 25 tablet 2  . ofloxacin (OCUFLOX) 0.3 % ophthalmic solution Place 1 drop into the left eye 4 (four) times daily.    Marland Kitchen REPATHA SURECLICK 277 MG/ML SOAJ A1OIN    . Semaglutide (OZEMPIC, 0.25 OR 0.5 MG/DOSE, [place]) Inject into the skin once a week.     No facility-administered medications prior to visit.    PAST MEDICAL HISTORY: Past Medical History:    Diagnosis Date  . CKD (chronic kidney disease)   . Coronary artery disease   . Diabetes mellitus without complication ([place])   . Fibromyalgia   . H/O shoulder surgery 2017   right  . 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 History of lupus    "tested as borderline"  . Hyperlipidemia   . Hypertension   . Lumbar pain   . Migraine   . OSA (obstructive sleep apnea)    failed CPAP therapy in the past  . Rosacea     PAST SURGICAL HISTORY: Past Surgical History:  Procedure Laterality Date  . ANKLE SURGERY Right 2002  . CARDIAC CATHETERIZATION  01/2017   3 Stents  . CORONARY ANGIOPLASTY  01/2007  . CORONARY ARTERY BYPASS GRAFT  2002  . HERNIA REPAIR  2005  . SHOULDER SURGERY  Right 2017  . TOTAL VAGINAL HYSTERECTOMY  12/1990  . WRIST SURGERY  2006    FAMILY HISTORY: Family History  Problem Relation Age of Onset  . Stroke Mother   . Lung cancer Mother   . Hypertension Mother   . Heart attack Father   . Diabetes Father   . Diabetes Sister   . Diabetes Brother   . Heart failure Brother   . Osteoporosis Sister   . Hypertension Sister   . Hyperlipidemia Sister   . Arthritis Sister   . Heart disease Brother   . Heart failure Brother   . Diabetes Brother   . Hypertension Brother     SOCIAL HISTORY: Social History   Socioeconomic History  . Marital status: Divorced    Spouse name: Not on file  . Number of children: 2  . Years of education: college  . Highest education level: Bachelor's degree (e.g., BA, AB, BS)  Occupational History  . Occupation: Retired  Tobacco Use  . Smoking status: Never Smoker  . Smokeless tobacco: Never Used  Substance and Sexual Activity  . Alcohol use: Yes    Comment: rarely  . Drug use: Never  . Sexual activity: Not on file  Other Topics Concern  . Not on file  Social History Narrative   Lives with cousin      Steps inside of home      Right and left handed      Highest level of education- bachelors degree       Retired from [employer]      Occasional use of caffeine.      Social Determinants of Health   Financial Resource Strain:   . Difficulty of Paying Living Expenses:   Food Insecurity:   . Worried About Charity fundraiser in the Last Year:   . Arboriculturist in the Last Year:   Transportation Needs:   . Film/video editor (Medical):   Marland Kitchen Lack of Transportation (Non-Medical):   Physical Activity:   . Days of Exercise per Week:   . Minutes of Exercise per Session:   Stress:   . Feeling of Stress :   Social Connections:   . Frequency of Communication with Friends and Family:   . Frequency of Social Gatherings with Friends and Family:   . Attends Religious Services:   . Active Member of Clubs or Organizations:   . Attends Archivist Meetings:   Marland Kitchen Marital Status:   Intimate Partner Violence:   . Fear of Current or Ex-Partner:   . Emotionally Abused:   Marland Kitchen Physically Abused:   . Sexually Abused:    PHYSICAL EXAM  Vitals:   02/28/20 1320  BP: 136/87  Pulse: 76  Temp: (!) 97.4 F (36.3 C)  Weight: 185 lb 9.6 oz (84.2 kg)  Height: '5\' 5"'  (1.651 m)   Body mass index is 30.89 kg/m.  Generalized: Well developed, in no acute distress   Neurological examination  Mentation: Alert oriented to time, place, history taking. Follows all commands speech and language fluent Cranial nerve II-XII: Pupils were equal round reactive to light. Extraocular movements were full, visual field were full on confrontational test. Facial sensation and strength were normal. Head turning and shoulder shrug were normal and symmetric. Motor: The motor testing reveals 5 over 5 strength of all 4 extremities. Good symmetric motor tone is noted throughout.  Sensory: Sensory testing is intact to soft touch on all 4 extremities. No evidence  of extinction is noted.  Coordination: Cerebellar testing reveals good finger-nose-finger and heel-to-shin bilaterally.  Gait and station: Gait is  cautious  Reflexes: Deep tendon reflexes are symmetric and normal bilaterally.   DIAGNOSTIC DATA (LABS, IMAGING, TESTING) - I reviewed patient records, labs, notes, testing and imaging myself where available.  No results found for: WBC, HGB, HCT, MCV, PLT No results found for: NA, K, CL, CO2, GLUCOSE, BUN, CREATININE, CALCIUM, PROT, ALBUMIN, AST, ALT, ALKPHOS, BILITOT, GFRNONAA, GFRAA No results found for: CHOL, HDL, LDLCALC, LDLDIRECT, TRIG, CHOLHDL No results found for: HGBA1C No results found for: VITAMINB12 No results found for: TSH    ASSESSMENT AND PLAN 69 y.o. 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 here with:  1.  Chronic migraine headache -Continued headache 3 times a week, mild to moderate  -Continue Aimovig 140 mg monthly injection -She stopped Topamax due to kidney concern -Continue Depakote ER 250 mg every night for headache prevention -Continue Fioricet, may mix with tizanidine, Zofran for acute treatment, not a candidate for triptan due to history of coronary artery disease, open heart surgery (called pharmacy Fioricet filled 8/12, 1/6, refill ready, 2 refills left) -We will check routine blood work, along with TSH, CRP, sed rate, and MRI of the brain without contrast due to reported continued headache  2. Report of passing out episodes  -Has been going on since at least 2017 -Cardiology has not determined etiology, wore cardiac monitor, no causal relation with any specific rhythm  -Not related to headache, says daily if walking, turning head to the left  -Will order EEG, low suspicion for seizure at this time, MRI of the brain  -Follow-up in 4 months with Dr.  Krista Blue  I spent 25 minutes with the patient. 50% of this time was spent discussing her plan of care  Butler Denmark, AGNP-C, DNP 02/28/2020, 1:40 PM [facility] [address], [place], [place] [address] [phone]

## 2020-02-28 ENCOUNTER — Telehealth: Payer: Self-pay | Admitting: Neurology

## 2020-02-28 ENCOUNTER — Encounter: Payer: Self-pay | Admitting: Neurology

## 2020-02-28 ENCOUNTER — Other Ambulatory Visit: Payer: Self-pay

## 2020-02-28 ENCOUNTER — Ambulatory Visit (INDEPENDENT_AMBULATORY_CARE_PROVIDER_SITE_OTHER): Payer: Medicare Other | Admitting: Neurology

## 2020-02-28 VITALS — BP 136/87 | HR 76 | Temp 97.4°F | Ht 65.0 in | Wt 185.6 lb

## 2020-02-28 DIAGNOSIS — G43709 Chronic migraine without aura, not intractable, without status migrainosus: Secondary | ICD-10-CM

## 2020-02-28 DIAGNOSIS — R55 Syncope and collapse: Secondary | ICD-10-CM | POA: Diagnosis not present

## 2020-02-28 DIAGNOSIS — R000 Tachycardia, unspecified: Secondary | ICD-10-CM

## 2020-02-28 DIAGNOSIS — IMO0002 Reserved for concepts with insufficient information to code with codable children: Secondary | ICD-10-CM

## 2020-02-28 DIAGNOSIS — R519 Headache, unspecified: Secondary | ICD-10-CM | POA: Diagnosis not present

## 2020-02-28 MED ORDER — TIZANIDINE HCL 2 MG PO CAPS: 2.0000 mg | ORAL_CAPSULE | Freq: Three times a day (TID) | ORAL | 1 refills | Status: AC | PRN

## 2020-02-28 NOTE — Patient Instructions (Signed)
Will order MRI of the brain  Check blood work today  Continue current medications  For acute headache, can take Fiorcet and tizanidine if needed Order EEG for passing out episodes

## 2020-02-28 NOTE — Progress Notes (Signed)
I have reviewed and agreed above plan. 

## 2020-02-28 NOTE — Telephone Encounter (Signed)
Medicare/bcbs fed order sent to GI. No auth they will reach out to the patient to schedule.  °

## 2020-02-29 ENCOUNTER — Encounter: Payer: Self-pay | Admitting: Neurology

## 2020-02-29 LAB — COMPREHENSIVE METABOLIC PANEL
ALT: 16 IU/L (ref 0–32)
AST: 21 IU/L (ref 0–40)
Albumin/Globulin Ratio: 1.7 (ref 1.2–2.2)
Albumin: 4.2 g/dL (ref 3.8–4.8)
Alkaline Phosphatase: 81 IU/L (ref 39–117)
BUN/Creatinine Ratio: 12 (ref 12–28)
BUN: 13 mg/dL (ref 8–27)
Bilirubin Total: 0.2 mg/dL (ref 0.0–1.2)
CO2: 26 mmol/L (ref 20–29)
Calcium: 9.8 mg/dL (ref 8.7–10.3)
Chloride: 102 mmol/L (ref 96–106)
Creatinine, Ser: 1.05 mg/dL — ABNORMAL HIGH (ref 0.57–1.00)
GFR calc Af Amer: 64 mL/min/{1.73_m2} (ref >59)
GFR calc non Af Amer: 55 mL/min/{1.73_m2} — ABNORMAL LOW (ref >59)
Globulin, Total: 2.5 g/dL (ref 1.5–4.5)
Glucose: 111 mg/dL — ABNORMAL HIGH (ref 65–99)
Potassium: 5.3 mmol/L — ABNORMAL HIGH (ref 3.5–5.2)
Sodium: 141 mmol/L (ref 134–144)
Total Protein: 6.7 g/dL (ref 6.0–8.5)

## 2020-02-29 LAB — CBC WITH DIFFERENTIAL/PLATELET
Basophils Absolute: 0 10*3/uL (ref 0.0–0.2)
Basos: 1 %
EOS (ABSOLUTE): 0.1 10*3/uL (ref 0.0–0.4)
Eos: 1 %
Hematocrit: 40.1 % (ref 34.0–46.6)
Hemoglobin: 13.6 g/dL (ref 11.1–15.9)
Immature Grans (Abs): 0 10*3/uL (ref 0.0–0.1)
Immature Granulocytes: 0 %
Lymphocytes Absolute: 3.7 10*3/uL — ABNORMAL HIGH (ref 0.7–3.1)
Lymphs: 49 %
MCH: 29.4 pg (ref 26.6–33.0)
MCHC: 33.9 g/dL (ref 31.5–35.7)
MCV: 87 fL (ref 79–97)
Monocytes Absolute: 0.6 10*3/uL (ref 0.1–0.9)
Monocytes: 8 %
Neutrophils Absolute: 3.1 10*3/uL (ref 1.4–7.0)
Neutrophils: 41 %
Platelets: 352 10*3/uL (ref 150–450)
RBC: 4.63 x10E6/uL (ref 3.77–5.28)
RDW: 12.8 % (ref 11.7–15.4)
WBC: 7.5 10*3/uL (ref 3.4–10.8)

## 2020-02-29 LAB — SEDIMENTATION RATE: Sed Rate: 8 mm/hr (ref 0–40)

## 2020-02-29 LAB — TSH: TSH: 3.72 u[IU]/mL (ref 0.450–4.500)

## 2020-02-29 LAB — C-REACTIVE PROTEIN: CRP: <1 mg/L (ref 0–10)

## 2020-03-03 ENCOUNTER — Encounter (HOSPITAL_COMMUNITY): Payer: Self-pay

## 2020-03-03 ENCOUNTER — Ambulatory Visit (HOSPITAL_COMMUNITY)
Admission: EM | Admit: 2020-03-03 | Discharge: 2020-03-03 | Disposition: A | Discharge status: Home / Self Care | Payer: Medicare Other | Attending: Family Medicine | Admitting: Family Medicine

## 2020-03-03 ENCOUNTER — Other Ambulatory Visit: Payer: Self-pay

## 2020-03-03 ENCOUNTER — Telehealth: Payer: Self-pay | Admitting: *Deleted

## 2020-03-03 DIAGNOSIS — Z23 Encounter for immunization: Secondary | ICD-10-CM

## 2020-03-03 DIAGNOSIS — S61210A Laceration without foreign body of right index finger without damage to nail, initial encounter: Secondary | ICD-10-CM

## 2020-03-03 DIAGNOSIS — W268XXA Contact with other sharp object(s), not elsewhere classified, initial encounter: Secondary | ICD-10-CM

## 2020-03-03 MED ORDER — TETANUS-DIPHTH-ACELL PERTUSSIS 5-2.5-18.5 LF-MCG/0.5 IM SUSP
0.5000 mL | Freq: Once | INTRAMUSCULAR | Status: AC
  Administered 2020-03-03: 0.5 mL via INTRAMUSCULAR

## 2020-03-03 MED ORDER — TETANUS-DIPHTH-ACELL PERTUSSIS 5-2.5-18.5 LF-MCG/0.5 IM SUSP
INTRAMUSCULAR | Status: AC
  Filled 2020-03-03: qty 0.5

## 2020-03-03 NOTE — ED Triage Notes (Signed)
Pt cut her right index finger on a lid when opening a can of beans.

## 2020-03-03 NOTE — Telephone Encounter (Signed)
-----   Message from Glean Salvo, NP sent at 03/03/2020  6:12 AM EDT ----- Please call the patient.  CBC was unremarkable.  CMP showed mildly elevated random glucose 111, creatinine 1.05, potassium 5.3 (unsure why elevated? Could be hemolysis of sample?, she should correlate with PCP). The rest of blood work was normal. She has reported kidney issues previously, with higher potassium, creatinine, she should discuss with PCP. I don't have much previous blood work to compare.

## 2020-03-03 NOTE — Telephone Encounter (Signed)
Spoke to pt and relayed the lab results to her per SS/NP note.  She is to f/u with pcp as needed.  She appreciated call.  She has seen renal MD as well. I will fax results to pcp.

## 2020-03-03 NOTE — Discharge Instructions (Signed)
Keep finger immobilized to prevent glue from pulling off prematurely.  Ideally keep glue in place for the next 5 days or longer if able.  It will eventually peel off.  If comes off sooner simply cleanse the wound with soap and water daily, keep immobilized until healed, cover with bandage.  Return if develop signs of infection- redness, increased pain, or drainage

## 2020-03-04 DIAGNOSIS — S61210A Laceration without foreign body of right index finger without damage to nail, initial encounter: Secondary | ICD-10-CM | POA: Diagnosis not present

## 2020-03-04 NOTE — ED Provider Notes (Signed)
[facility]    CSN: [id number] Arrival date & time: 03/03/20  1330      History   Chief Complaint Chief Complaint  Patient presents with  . Finger Injury    HPI Holly Nichols is a 69 y.o. female.   Holly Nichols presents with complaints of laceration to her right hand pointer finger after she accidentally cut it on a lid from a can. This occurred approximately 1 hour prior to arrival. She is on plavix, however she applied pressure and bleeding has been controlled. Has not cleansed the wound. She doesn't know her last TDAP. No numbness tingling or weakness to the finger. Full ROM.   ROS per HPI, negative if not otherwise mentioned.      Past Medical History:  Diagnosis Date  . CKD (chronic kidney disease)   . Coronary artery disease   . Diabetes mellitus without complication ([place])   . Fibromyalgia   . H/O shoulder surgery 2017   right  . 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 History of lupus    "tested as borderline"  . Hyperlipidemia   . Hypertension   . Lumbar pain   . Migraine   . OSA (obstructive sleep apnea)    failed CPAP therapy in the past  . Rosacea     Patient Active Problem List   Diagnosis Date Noted  . Tachycardia 09/03/2019  . 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 Chronic migraine 06/19/2019  . Syncope and collapse 05/30/2019  . Coronary artery disease of native artery of native heart with stable angina pectoris ([place]) 05/30/2019  . Mixed hyperlipidemia 05/30/2019    Past Surgical History:  Procedure Laterality Date  . ANKLE SURGERY Right 2002  . CARDIAC CATHETERIZATION  01/2017   3 Stents  . CORONARY ANGIOPLASTY  01/2007  . CORONARY ARTERY BYPASS GRAFT  2002  . HERNIA REPAIR  2005  . SHOULDER SURGERY Right 2017  . TOTAL VAGINAL HYSTERECTOMY  12/1990  . WRIST SURGERY  2006    OB History   No obstetric history on file.      Home Medications    Prior to Admission medications   Medication Sig Start  Date End Date Taking? Authorizing Provider  Biotin 10000 MCG TABS biotin  Take one tablet by mouth daily.    [provider]  butalbital-acetaminophen-caffeine (FIORICET) 50-325-40 MG tablet Take 1 tablet by mouth every 6 (six) hours as needed. 02/27/20   [provider]  Cholecalciferol (VITAMIN D3) 125 MCG (5000 UT) TBDP Take by mouth.    [provider]  clindamycin (CLEOCIN) 150 MG capsule Take 150 mg by mouth 4 (four) times daily. 02/28/20   [provider]  clobetasol (TEMOVATE) 0.05 % external solution clobetasol 0.05 % scalp solution    [provider]  clopidogrel (PLAVIX) 75 MG tablet clopidogrel 75 mg tablet  TAKE 1 TABLET EVERY DAY 02/27/20   Patwardhan, Reynold Bowen, MD  Cyanocobalamin 5000 MCG CAPS Take by mouth.    [provider]  cyclobenzaprine (FLEXERIL) 10 MG tablet Take 10 mg by mouth daily.  04/14/19   [provider]  divalproex (DEPAKOTE ER) 250 MG 24 hr tablet Take 1 tablet (250 mg total) by mouth daily. 08/13/19   Marcial Pacas, MD  doxycycline (MONODOX) 50 MG capsule Take 50 mg by mouth daily. 02/11/20   [provider]  doxycycline (VIBRAMYCIN) 50 MG capsule Take 50 mg by mouth daily.    [provider]  DUREZOL 0.05 % EMUL Place 1 drop into the left eye 2 (two) times daily. 12/26/19   [provider]  Erenumab-aooe (AIMOVIG) 140 MG/ML SOAJ Inject 140 mg into the skin every 30 (thirty) days. 07/31/19   Marcial Pacas, MD  fluticasone (FLONASE) 50 MCG/ACT nasal spray Place into the nose as needed.    [provider]  glucagon (GLUCAGON EMERGENCY) 1 MG injection Glucagon Emergency Kit (human-recomb) 1 mg solution for injection  Take by injection route as needed for 30 days.    [provider]  HYDROcodone-acetaminophen (NORCO/VICODIN) 5-325 MG tablet Take 1 tablet by mouth 3 (three) times daily as needed. 03/03/20   [provider]  isosorbide dinitrate (ISORDIL) 30 MG tablet  Take 1 tablet (30 mg total) by mouth at bedtime. 01/01/20   Patwardhan, Reynold Bowen, MD  ketoconazole (NIZORAL) 2 % shampoo 3x weekly 02/15/19   [provider]  lidocaine (XYLOCAINE) 5 % ointment  01/09/20   [provider]  lidocaine-prilocaine (EMLA) cream Apply topically daily.     [provider]  metoprolol succinate (TOPROL-XL) 50 MG 24 hr tablet Take 1 tablet (50 mg total) by mouth daily. 09/03/19   Patwardhan, Reynold Bowen, MD  metroNIDAZOLE (METROCREAM) 0.75 % cream Apply 1 application topically daily.     [provider]  metroNIDAZOLE (METROGEL) 1 % gel  02/15/20   [provider]  Multiple Vitamins-Minerals (MULTIVITAMIN ADULTS 50+ PO) daily.    [provider]  nitroGLYCERIN (NITROSTAT) 0.4 MG SL tablet PLACE 1 TABLET (0.4 MG TOTAL) UNDER THE TONGUE EVERY 5 (FIVE) MINUTES AS NEEDED. 02/05/20   Patwardhan, Manish J, MD  ofloxacin (OCUFLOX) 0.3 % ophthalmic solution Place 1 drop into the left eye 4 (four) times daily. 12/26/19   [provider]  ondansetron (ZOFRAN-ODT) 4 MG disintegrating tablet Take 4 mg by mouth every 8 (eight) hours as needed. 02/14/20   [provider]  REPATHA SURECLICK 269 MG/ML SOAJ [id number] 05/07/19   [provider]  Semaglutide (OZEMPIC, 0.25 OR 0.5 MG/DOSE, [place]) Inject into the skin once a week.    [provider]  tizanidine (ZANAFLEX) 2 MG capsule Take 1 capsule (2 mg total) by mouth 3 (three) times daily as needed for muscle spasms (as needed for acute migraine headache). 02/28/20   Suzzanne Cloud, NP    Family History Family History  Problem Relation Age of Onset  . Stroke Mother   . Lung cancer Mother   . Hypertension Mother   . Heart attack Father   . Diabetes Father   . Diabetes Sister   . Diabetes Brother   . Heart failure Brother   . Osteoporosis Sister   . Hypertension Sister   . Hyperlipidemia Sister   . Arthritis Sister   . Heart disease Brother   . Heart failure  Brother   . Diabetes Brother   . Hypertension Brother     Social History Social History   Tobacco Use  . Smoking status: Never Smoker  . Smokeless tobacco: Never Used  Substance Use Topics  . Alcohol use: Yes    Comment: rarely  . Drug use: Never     Allergies   Acetaminophen, Aspirin, Codeine, Depakote [divalproex sodium], Hydroxychloroquine, Hydroxychloroquine sulfate, Lithium, Morphine, Nortriptyline, Nsaids, Penicillin g, Prednisone, Statins, Tape, Topiramate, Tramadol, Lisinopril, and Thimerosal   Review of Systems Review of Systems   Physical Exam Triage Vital Signs ED Triage Vitals  Enc Vitals Group     BP 03/03/20 1443 Marland Kitchen)  157/87     Pulse Rate 03/03/20 1443 85     Resp 03/03/20 1443 18     Temp 03/03/20 1443 98.8 F (37.1 C)     Temp Source 03/03/20 1443 Oral     SpO2 03/03/20 1443 100 %     Weight 03/03/20 1440 187 lb 3.2 oz (84.9 kg)     Height --      Head Circumference --      Peak Flow --      Pain Score 03/03/20 1440 0     Pain Loc --      Pain Edu? --      Excl. in [place]? --    No data found.  Updated Vital Signs BP (!) 157/87 (BP Location: Left Arm)   Pulse 85   Temp 98.8 F (37.1 C) (Oral)   Resp 18   Wt 187 lb 3.2 oz (84.9 kg)   SpO2 100%   BMI 31.15 kg/m    Physical Exam Constitutional:      General: She is not in acute distress.    Appearance: She is well-developed.  Cardiovascular:     Rate and Rhythm: Normal rate.  Pulmonary:     Effort: Pulmonary effort is normal.  Musculoskeletal:     Right hand: Laceration present.     Comments: Right hand pointer finger with approximately 1 cm laceration across middle phalanx, approximately 51m in depth, very well approximated; full ROM and strength with the finger with no apparent tendon involvement. No active bleeding; cap refill < 2 seconds  No bony tenderness  Skin:    General: Skin is warm and dry.  Neurological:     Mental Status: She is alert and oriented to person, place, and  time.      UC Treatments / Results  Labs (all labs ordered are listed, but only abnormal results are displayed) Labs Reviewed - No data to display  EKG   Radiology No results found.  Procedures Laceration Repair  Date/Time: 03/04/2020 11:54 AM Performed by: BZigmund Gottron NP Authorized by: HVanessa Kick MD   Consent:    Consent obtained:  Verbal   Consent given by:  Patient   Risks discussed:  Infection, pain, poor cosmetic result and poor wound healing   Alternatives discussed:  No treatment, delayed treatment and observation Anesthesia (see MAR for exact dosages):    Anesthesia method:  None Laceration details:    Location:  Finger   Finger location:  R index finger   Length (cm):  1   Depth (mm):  3 Repair type:    Repair type:  Simple Exploration:    Hemostasis achieved with:  Direct pressure   Wound exploration: wound explored through full range of motion and entire depth of wound probed and visualized     Contaminated: no   Treatment:    Area cleansed with:  Betadine and Shur-Clens   Amount of cleaning:  Extensive Skin repair:    Repair method:  Tissue adhesive Approximation:    Approximation:  Close Post-procedure details:    Dressing:  Splint for protection   Patient tolerance of procedure:  Tolerated well, no immediate complications   (including critical care time)  Medications Ordered in UC Medications  Tdap (BOOSTRIX) injection 0.5 mL (0.5 mLs Intramuscular Given 03/03/20 1535)    Initial Impression / Assessment and Plan / UC Course  I have reviewed the triage vital signs and the nursing notes.  Pertinent labs & imaging results that  were available during my care of the patient were reviewed by me and considered in my medical decision making (see chart for details).     Very well approximation, opted to close with dermabond, patient tolerated well. Thorough cleansing of wound performed prior to closure. No indication of tendon injury.  Bleeding well controlled. Splint to allow for wound immobilization. Return precautions provided. Patient verbalized understanding and agreeable to plan.   Final Clinical Impressions(s) / UC Diagnoses   Final diagnoses:  Laceration of right index finger without foreign body without damage to nail, initial encounter     Discharge Instructions     Keep finger immobilized to prevent glue from pulling off prematurely.  Ideally keep glue in place for the next 5 days or longer if able.  It will eventually peel off.  If comes off sooner simply cleanse the wound with soap and water daily, keep immobilized until healed, cover with bandage.  Return if develop signs of infection- redness, increased pain, or drainage   ED Prescriptions    None     PDMP not reviewed this encounter.   Zigmund Gottron, NP 03/04/20 1156

## 2020-03-19 ENCOUNTER — Ambulatory Visit (INDEPENDENT_AMBULATORY_CARE_PROVIDER_SITE_OTHER): Payer: Medicare Other | Admitting: Neurology

## 2020-03-19 ENCOUNTER — Other Ambulatory Visit: Payer: Self-pay

## 2020-03-19 DIAGNOSIS — R55 Syncope and collapse: Secondary | ICD-10-CM | POA: Diagnosis not present

## 2020-03-19 NOTE — Procedures (Signed)
    History:  Holly Nichols is a 69 year old patient with a history of headache and low back pain.  The patient has had fainting episodes since 2017 unrelated to the headache.  She claims that she walks and turns her head to the left she might have an episode where her eyes rolled back and she may fall into something.  She averages about 3 episodes a year.  She is being evaluated for these events.  This is a routine EEG.  No skull defects are noted.  Medications include vitamin D, Plavix, vitamin B12, Flexeril, Depakote, Aimovig, Flonase, isosorbide dinitrate, metoprolol, multivitamins, nitroglycerin, Repatha, and Ozempic.  EEG classification: Normal awake and drowsy  Description of the recording: The background rhythms of this recording consists of a fairly well modulated medium amplitude alpha rhythm of 9 Hz that is reactive to eye opening and closure. As the record progresses, the patient appears to remain in the waking state throughout the recording. Photic stimulation was performed, resulting in a bilateral and symmetric photic driving response. Hyperventilation was also performed, resulting in a minimal buildup of the background rhythm activities without significant slowing seen. Toward the end of the recording, the patient enters the drowsy state with slight symmetric slowing seen. The patient never enters stage II sleep. At no time during the recording does there appear to be evidence of spike or spike wave discharges or evidence of focal slowing. EKG monitor shows no evidence of cardiac rhythm abnormalities with a heart rate of 78.  Impression: This is a normal EEG recording in the waking and drowsy state. No evidence of ictal or interictal discharges are seen.

## 2020-03-20 ENCOUNTER — Telehealth: Payer: Self-pay | Admitting: Neurology

## 2020-03-20 NOTE — Telephone Encounter (Signed)
Please call the patient. EEG was normal. No evidence of seizure was seen during testing.   Impression: This is a normal EEG recording in the waking and drowsy state. No evidence of ictal or interictal discharges are seen.

## 2020-03-20 NOTE — Telephone Encounter (Signed)
I called pt and relayed that EEG test was normal, no seizures during testing. She verbalized understanding.

## 2020-03-27 ENCOUNTER — Ambulatory Visit
Admission: RE | Admit: 2020-03-27 | Discharge: 2020-03-27 | Disposition: A | Discharge status: Home / Self Care | Payer: Medicare Other | Source: Ambulatory Visit | Attending: Neurology | Admitting: Neurology

## 2020-03-27 ENCOUNTER — Other Ambulatory Visit: Payer: Self-pay

## 2020-03-27 DIAGNOSIS — R519 Headache, unspecified: Secondary | ICD-10-CM

## 2020-04-02 DIAGNOSIS — H2513 Age-related nuclear cataract, bilateral: Secondary | ICD-10-CM | POA: Diagnosis not present

## 2020-04-02 DIAGNOSIS — H43811 Vitreous degeneration, right eye: Secondary | ICD-10-CM | POA: Diagnosis not present

## 2020-04-02 DIAGNOSIS — E113293 Type 2 diabetes mellitus with mild nonproliferative diabetic retinopathy without macular edema, bilateral: Secondary | ICD-10-CM | POA: Diagnosis not present

## 2020-04-02 DIAGNOSIS — H4321 Crystalline deposits in vitreous body, right eye: Secondary | ICD-10-CM | POA: Diagnosis not present

## 2020-04-03 DIAGNOSIS — H18413 Arcus senilis, bilateral: Secondary | ICD-10-CM | POA: Diagnosis not present

## 2020-04-03 DIAGNOSIS — H25043 Posterior subcapsular polar age-related cataract, bilateral: Secondary | ICD-10-CM | POA: Diagnosis not present

## 2020-04-03 DIAGNOSIS — H2513 Age-related nuclear cataract, bilateral: Secondary | ICD-10-CM | POA: Diagnosis not present

## 2020-04-03 DIAGNOSIS — H2511 Age-related nuclear cataract, right eye: Secondary | ICD-10-CM | POA: Diagnosis not present

## 2020-04-03 DIAGNOSIS — H25013 Cortical age-related cataract, bilateral: Secondary | ICD-10-CM | POA: Diagnosis not present

## 2020-04-21 DIAGNOSIS — H43391 Other vitreous opacities, right eye: Secondary | ICD-10-CM | POA: Diagnosis not present

## 2020-04-21 DIAGNOSIS — H2511 Age-related nuclear cataract, right eye: Secondary | ICD-10-CM | POA: Diagnosis not present

## 2020-04-21 DIAGNOSIS — E113311 Type 2 diabetes mellitus with moderate nonproliferative diabetic retinopathy with macular edema, right eye: Secondary | ICD-10-CM | POA: Diagnosis not present

## 2020-04-21 DIAGNOSIS — H33301 Unspecified retinal break, right eye: Secondary | ICD-10-CM | POA: Diagnosis not present

## 2020-04-21 DIAGNOSIS — E113391 Type 2 diabetes mellitus with moderate nonproliferative diabetic retinopathy without macular edema, right eye: Secondary | ICD-10-CM | POA: Diagnosis not present

## 2020-04-22 DIAGNOSIS — E113291 Type 2 diabetes mellitus with mild nonproliferative diabetic retinopathy without macular edema, right eye: Secondary | ICD-10-CM | POA: Diagnosis not present

## 2020-04-22 DIAGNOSIS — H33311 Horseshoe tear of retina without detachment, right eye: Secondary | ICD-10-CM | POA: Diagnosis not present

## 2020-04-22 DIAGNOSIS — H4321 Crystalline deposits in vitreous body, right eye: Secondary | ICD-10-CM | POA: Diagnosis not present

## 2020-04-25 DIAGNOSIS — R1013 Epigastric pain: Secondary | ICD-10-CM | POA: Diagnosis not present

## 2020-04-25 DIAGNOSIS — E1165 Type 2 diabetes mellitus with hyperglycemia: Secondary | ICD-10-CM | POA: Diagnosis not present

## 2020-04-25 DIAGNOSIS — E669 Obesity, unspecified: Secondary | ICD-10-CM | POA: Diagnosis not present

## 2020-04-25 DIAGNOSIS — Z1329 Encounter for screening for other suspected endocrine disorder: Secondary | ICD-10-CM | POA: Diagnosis not present

## 2020-04-25 DIAGNOSIS — Z1321 Encounter for screening for nutritional disorder: Secondary | ICD-10-CM | POA: Diagnosis not present

## 2020-04-25 DIAGNOSIS — Z136 Encounter for screening for cardiovascular disorders: Secondary | ICD-10-CM | POA: Diagnosis not present

## 2020-04-25 DIAGNOSIS — I2510 Atherosclerotic heart disease of native coronary artery without angina pectoris: Secondary | ICD-10-CM | POA: Diagnosis not present

## 2020-04-25 DIAGNOSIS — Z5181 Encounter for therapeutic drug level monitoring: Secondary | ICD-10-CM | POA: Diagnosis not present

## 2020-04-25 DIAGNOSIS — I10 Essential (primary) hypertension: Secondary | ICD-10-CM | POA: Diagnosis not present

## 2020-04-29 DIAGNOSIS — H31091 Other chorioretinal scars, right eye: Secondary | ICD-10-CM | POA: Diagnosis not present

## 2020-05-05 DIAGNOSIS — H2512 Age-related nuclear cataract, left eye: Secondary | ICD-10-CM | POA: Diagnosis not present

## 2020-07-01 ENCOUNTER — Ambulatory Visit: Payer: Medicare Other | Admitting: Neurology

## 2020-07-18 ENCOUNTER — Ambulatory Visit: Payer: Medicare Other | Admitting: Cardiology

## 2020-07-29 DIAGNOSIS — G4733 Obstructive sleep apnea (adult) (pediatric): Secondary | ICD-10-CM | POA: Diagnosis not present

## 2020-07-29 DIAGNOSIS — E785 Hyperlipidemia, unspecified: Secondary | ICD-10-CM | POA: Diagnosis not present

## 2020-07-29 DIAGNOSIS — I259 Chronic ischemic heart disease, unspecified: Secondary | ICD-10-CM | POA: Diagnosis not present

## 2020-07-29 DIAGNOSIS — R55 Syncope and collapse: Secondary | ICD-10-CM | POA: Diagnosis not present

## 2020-07-29 DIAGNOSIS — I10 Essential (primary) hypertension: Secondary | ICD-10-CM | POA: Diagnosis not present

## 2020-07-29 DIAGNOSIS — E669 Obesity, unspecified: Secondary | ICD-10-CM | POA: Diagnosis not present

## 2020-07-29 DIAGNOSIS — Z789 Other specified health status: Secondary | ICD-10-CM | POA: Diagnosis not present

## 2020-07-29 DIAGNOSIS — I252 Old myocardial infarction: Secondary | ICD-10-CM | POA: Diagnosis not present

## 2020-07-29 DIAGNOSIS — R002 Palpitations: Secondary | ICD-10-CM | POA: Diagnosis not present

## 2020-07-29 DIAGNOSIS — E119 Type 2 diabetes mellitus without complications: Secondary | ICD-10-CM | POA: Diagnosis not present

## 2020-07-29 DIAGNOSIS — I5030 Unspecified diastolic (congestive) heart failure: Secondary | ICD-10-CM | POA: Diagnosis not present

## 2020-08-01 ENCOUNTER — Other Ambulatory Visit: Payer: Self-pay | Admitting: Neurology

## 2020-08-15 ENCOUNTER — Telehealth: Payer: Self-pay

## 2020-08-20 ENCOUNTER — Other Ambulatory Visit: Payer: Self-pay | Admitting: Neurology

## 2020-09-05 ENCOUNTER — Other Ambulatory Visit: Payer: Self-pay | Admitting: Cardiology

## 2020-09-05 DIAGNOSIS — I25118 Atherosclerotic heart disease of native coronary artery with other forms of angina pectoris: Secondary | ICD-10-CM

## 2020-09-10 DIAGNOSIS — Z789 Other specified health status: Secondary | ICD-10-CM | POA: Diagnosis not present

## 2020-09-10 DIAGNOSIS — I259 Chronic ischemic heart disease, unspecified: Secondary | ICD-10-CM | POA: Diagnosis not present

## 2020-09-10 DIAGNOSIS — E669 Obesity, unspecified: Secondary | ICD-10-CM | POA: Diagnosis not present

## 2020-09-10 DIAGNOSIS — I252 Old myocardial infarction: Secondary | ICD-10-CM | POA: Diagnosis not present

## 2020-09-10 DIAGNOSIS — I5030 Unspecified diastolic (congestive) heart failure: Secondary | ICD-10-CM | POA: Diagnosis not present

## 2020-09-10 DIAGNOSIS — R002 Palpitations: Secondary | ICD-10-CM | POA: Diagnosis not present

## 2020-09-10 DIAGNOSIS — R55 Syncope and collapse: Secondary | ICD-10-CM | POA: Diagnosis not present

## 2020-09-10 DIAGNOSIS — E119 Type 2 diabetes mellitus without complications: Secondary | ICD-10-CM | POA: Diagnosis not present

## 2020-09-10 DIAGNOSIS — G4733 Obstructive sleep apnea (adult) (pediatric): Secondary | ICD-10-CM | POA: Diagnosis not present

## 2020-09-10 DIAGNOSIS — I10 Essential (primary) hypertension: Secondary | ICD-10-CM | POA: Diagnosis not present

## 2020-09-10 DIAGNOSIS — E785 Hyperlipidemia, unspecified: Secondary | ICD-10-CM | POA: Diagnosis not present

## 2021-02-02 ENCOUNTER — Other Ambulatory Visit: Payer: Self-pay | Admitting: Cardiology

## 2021-02-02 DIAGNOSIS — I25118 Atherosclerotic heart disease of native coronary artery with other forms of angina pectoris: Secondary | ICD-10-CM
